# Patient Record
Sex: Male | Born: 1945 | Race: White | Hispanic: No | Marital: Married | Smoking: Never smoker
Health system: Southern US, Community
[De-identification: ages and names within clinical notes are randomized; demographics above are authoritative.]

## PROBLEM LIST (undated history)

## (undated) DIAGNOSIS — E785 Hyperlipidemia, unspecified: Secondary | ICD-10-CM

## (undated) DIAGNOSIS — R202 Paresthesia of skin: Secondary | ICD-10-CM

## (undated) DIAGNOSIS — M1990 Unspecified osteoarthritis, unspecified site: Secondary | ICD-10-CM

## (undated) DIAGNOSIS — R030 Elevated blood-pressure reading, without diagnosis of hypertension: Secondary | ICD-10-CM

## (undated) DIAGNOSIS — K219 Gastro-esophageal reflux disease without esophagitis: Secondary | ICD-10-CM

## (undated) HISTORY — DX: Gastro-esophageal reflux disease without esophagitis: K21.9

## (undated) HISTORY — DX: Paresthesia of skin: R20.2

## (undated) HISTORY — PX: COLONOSCOPY: SHX174

## (undated) HISTORY — PX: INGUINAL HERNIA REPAIR: SUR1180

## (undated) HISTORY — DX: Hyperlipidemia, unspecified: E78.5

## (undated) HISTORY — DX: Unspecified osteoarthritis, unspecified site: M19.90

## (undated) HISTORY — DX: Elevated blood-pressure reading, without diagnosis of hypertension: R03.0

---

## 2018-10-23 ENCOUNTER — Encounter: Payer: Self-pay | Admitting: Neurology

## 2018-11-28 ENCOUNTER — Other Ambulatory Visit: Payer: Self-pay

## 2018-11-29 ENCOUNTER — Ambulatory Visit (INDEPENDENT_AMBULATORY_CARE_PROVIDER_SITE_OTHER): Payer: Medicare Other | Admitting: Neurology

## 2018-11-29 ENCOUNTER — Encounter: Payer: Self-pay | Admitting: Neurology

## 2018-11-29 ENCOUNTER — Other Ambulatory Visit: Payer: Self-pay

## 2018-11-29 VITALS — BP 147/90 | HR 81 | Ht 71.0 in | Wt 193.0 lb

## 2018-11-29 DIAGNOSIS — R29818 Other symptoms and signs involving the nervous system: Secondary | ICD-10-CM | POA: Diagnosis not present

## 2018-11-29 DIAGNOSIS — G459 Transient cerebral ischemic attack, unspecified: Secondary | ICD-10-CM

## 2018-11-29 NOTE — Patient Instructions (Addendum)
Request records of MRI brain from PCP's office  CT angiogram of the head and neck.  We will call you with these results and let you know the next step.

## 2018-11-29 NOTE — Progress Notes (Signed)
[facility] Neurology [facility] Clinic Note - Initial Visit   Date: 11/29/18  [id number] DOB: 02/21/1945   Dear Dr. Egbert GaribaldiSlatosky:  Thank you for your kind referral of Bruce Watts for consultation of left side tingling. Although his history is well known to you, please allow us to reiterate it for the purpose of our medical record. The patient was accompanied to the clinic by self.   History of Present Illness: Bruce Watts is a 73 y.o. right-handed male with asthma, GERD, and hyperlipidemia presenting for evaluation of left face and arm paresthesia.   Starting around April/May 2020, he noticed numbness over the left side of the face which has remained constant since onset.  He also developed tingling over the left arm and leg which lasts a few hours and occurs several times per week.  He denies any specific trigger.  He denies vision changes, headache, arm or leg weakness. MRI brain was performed in October, which shows mild microvascular changes, no evidence of stroke.    Over the past three months, his blood pressure has been elevated.  Today, his BP is 147/90.  He previously worked in [employer].   Out-side paper records, electronic medical record, and images have been reviewed where available and summarized as:  Labs 07/16/2018:  lipid panel total cholesterol 161, triglycerides 163, HDL 31, LDL 97  MRI head without contrast 10/29/2018: No acute abnormality.  Mild white matter changes most likely chronic microvascular ischemia.  Past Medical History:  Diagnosis Date  . DJD (degenerative joint disease)   . Elevated blood pressure reading without diagnosis of hypertension   . GERD (gastroesophageal reflux disease)   . Hyperlipidemia   . Paresthesia     Past Surgical History:  Procedure Laterality Date  . COLONOSCOPY    . INGUINAL HERNIA REPAIR       Medications:  Outpatient Encounter Medications as of 11/29/2018  Medication Sig  . albuterol  (VENTOLIN HFA) 108 (90 Base) MCG/ACT inhaler   . fluticasone furoate-vilanterol (BREO ELLIPTA) 200-25 MCG/INH AEPB Inhale 1 puff into the lungs daily.  Marland Kitchen. omeprazole (PRILOSEC) 40 MG capsule Take 40 mg by mouth daily.  . pravastatin (PRAVACHOL) 20 MG tablet Take 20 mg by mouth daily.  . sildenafil (VIAGRA) 100 MG tablet Take 100 mg by mouth daily as needed for erectile dysfunction.   No facility-administered encounter medications on file as of 11/29/2018.     Allergies: No Known Allergies  Family History: Family History  Problem Relation Age of Onset  . Arthritis/Rheumatoid Mother   . CVA Mother   . Prostate cancer Brother     Social History: Social History   Tobacco Use  . Smoking status: Never Smoker  . Smokeless tobacco: Never Used  Substance Use Topics  . Alcohol use: Never    Frequency: Never  . Drug use: Never   Social History   Social History Narrative   Right handed   Two story home    Review of Systems:  CONSTITUTIONAL: No fevers, chills, night sweats, or weight loss.   EYES: No visual changes or eye pain ENT: No hearing changes.  No history of nose bleeds.   RESPIRATORY: No cough, wheezing and shortness of breath.   CARDIOVASCULAR: Negative for chest pain, and palpitations.   GI: Negative for abdominal discomfort, blood in stools or black stools.  No recent change in bowel habits.   GU:  No history of incontinence.   MUSCLOSKELETAL: No history of joint pain  or swelling.  No myalgias.   SKIN: Negative for lesions, rash, and itching.   HEMATOLOGY/ONCOLOGY: Negative for prolonged bleeding, bruising easily, and swollen nodes.  No history of cancer.   ENDOCRINE: Negative for cold or heat intolerance, polydipsia or goiter.   PSYCH:  No depression or anxiety symptoms.   NEURO: As Above.   Vital Signs:  BP (!) 147/90   Pulse 81   Ht 5\' 11"  (1.803 m)   Wt 193 lb (87.5 kg)   SpO2 97%   BMI 26.92 kg/m    General Medical Exam:   General:  Well appearing,  comfortable.   Eyes/ENT: see cranial nerve examination.   Neck:   No carotid bruits. Respiratory:  Clear to auscultation, good air entry bilaterally.   Cardiac:  Regular rate and rhythm, no murmur.   Extremities:  No deformities, edema, or skin discoloration.  Skin:  No rashes or lesions.  Neurological Exam: MENTAL STATUS including orientation to time, place, person, recent and remote memory, attention span and concentration, language, and fund of knowledge is normal.  Speech is not dysarthric.  CRANIAL NERVES: II:  No visual field defects.  Unremarkable fundi.   III-IV-VI: Pupils equal round and reactive to light.  Normal conjugate, extra-ocular eye movements in all directions of gaze.  No nystagmus.  No ptosis.   V:  Normal facial sensation.    VII:  Normal facial symmetry and movements.   VIII:  Normal hearing and vestibular function.   IX-X:  Normal palatal movement.   XI:  Normal shoulder shrug and head rotation.   XII:  Normal tongue strength and range of motion, no deviation or fasciculation.  MOTOR:  No atrophy, fasciculations or abnormal movements.  No pronator drift.   Upper Extremity:  Right  Left  Deltoid  5/5   5/5   Biceps  5/5   5/5   Triceps  5/5   5/5   Infraspinatus 5/5  5/5  Medial pectoralis 5/5  5/5  Wrist extensors  5/5   5/5   Wrist flexors  5/5   5/5   Finger extensors  5/5   5/5   Finger flexors  5/5   5/5   Dorsal interossei  5/5   5/5   Abductor pollicis  5/5   5/5   Tone (Ashworth scale)  0  0   Lower Extremity:  Right  Left  Hip flexors  5/5   5/5   Hip extensors  5/5   5/5   Adductor 5/5  5/5  Abductor 5/5  5/5  Knee flexors  5/5   5/5   Knee extensors  5/5   5/5   Dorsiflexors  5/5   5/5   Plantarflexors  5/5   5/5   Toe extensors  5/5   5/5   Toe flexors  5/5   5/5   Tone (Ashworth scale)  0  0   MSRs:  Right        Left                  brachioradialis 2+  2+  biceps 2+  2+  triceps 2+  2+  patellar 2+  2+  ankle jerk 2+  2+   Hoffman no  no  plantar response down  down   SENSORY:  Normal and symmetric perception of light touch, pinprick, vibration, and proprioception.  Romberg's sign absent.   COORDINATION/GAIT: Normal finger-to- nose-finger and heel-to-shin.  Intact rapid alternating movements bilaterally.  Able to rise from a chair without using arms.  Gait narrow based and stable. Tandem and stressed gait intact.    IMPRESSION: Left face and arm paresthesias.    - MRI brain shows no acute findings to explain paresthesias  - Exam is normal today  - Proceed with CTA head and neck to evaluate vessel status, if normal and he remains symptomatic, consider MRI cervical spine.   Thank you for allowing me to participate in patient's care.  If I can answer any additional questions, I would be pleased to do so.    Sincerely,    Anabelen Kaminsky K. Posey Pronto, DO

## 2018-12-13 ENCOUNTER — Ambulatory Visit
Admission: RE | Admit: 2018-12-13 | Discharge: 2018-12-13 | Disposition: A | Discharge status: Home / Self Care | Payer: Medicare Other | Source: Ambulatory Visit | Attending: Neurology | Admitting: Neurology

## 2018-12-13 ENCOUNTER — Other Ambulatory Visit: Payer: Self-pay

## 2018-12-13 DIAGNOSIS — R29818 Other symptoms and signs involving the nervous system: Secondary | ICD-10-CM

## 2018-12-13 DIAGNOSIS — G459 Transient cerebral ischemic attack, unspecified: Secondary | ICD-10-CM

## 2018-12-13 MED ORDER — IOPAMIDOL (ISOVUE-370) INJECTION 76%
75.0000 mL | Freq: Once | INTRAVENOUS | Status: AC | PRN
  Administered 2018-12-13: 75 mL via INTRAVENOUS

## 2018-12-17 ENCOUNTER — Other Ambulatory Visit (HOSPITAL_COMMUNITY): Payer: Self-pay | Admitting: Interventional Radiology

## 2018-12-17 ENCOUNTER — Other Ambulatory Visit: Payer: Self-pay

## 2018-12-17 ENCOUNTER — Telehealth: Payer: Self-pay | Admitting: Neurology

## 2018-12-17 DIAGNOSIS — G459 Transient cerebral ischemic attack, unspecified: Secondary | ICD-10-CM

## 2018-12-17 DIAGNOSIS — I773 Arterial fibromuscular dysplasia: Secondary | ICD-10-CM

## 2018-12-17 DIAGNOSIS — R29818 Other symptoms and signs involving the nervous system: Secondary | ICD-10-CM

## 2018-12-17 NOTE — Telephone Encounter (Signed)
Faxed notes to patients PCP and note Dr.Deveshwar office called back and they will contact patient and get patient scheduled for next week

## 2018-12-17 NOTE — Telephone Encounter (Signed)
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  These findings would explain transient left arm numbness.  To better assess his vessel status, I will refer him for cerebral angiogram.  In the meantime, I discussed medical management with continuation of aspirin 81mg , continue pravastatin 20mg , and will have him follow-up with his PCP for BP management.  He tells me that his BP has been elevated but he is currently not on medication.    Donika K. Posey Pronto, DO

## 2018-12-17 NOTE — Telephone Encounter (Signed)
Pending appt, left message with [place] [phone].at [facility]

## 2018-12-21 ENCOUNTER — Other Ambulatory Visit: Payer: Self-pay | Admitting: Radiology

## 2018-12-23 ENCOUNTER — Other Ambulatory Visit: Payer: Self-pay | Admitting: Student

## 2018-12-24 ENCOUNTER — Other Ambulatory Visit (HOSPITAL_COMMUNITY): Payer: Self-pay | Admitting: Interventional Radiology

## 2018-12-24 ENCOUNTER — Other Ambulatory Visit: Payer: Self-pay

## 2018-12-24 ENCOUNTER — Telehealth (HOSPITAL_COMMUNITY): Payer: Self-pay

## 2018-12-24 ENCOUNTER — Encounter (HOSPITAL_COMMUNITY): Payer: Self-pay

## 2018-12-24 ENCOUNTER — Ambulatory Visit (HOSPITAL_COMMUNITY)
Admission: RE | Admit: 2018-12-24 | Discharge: 2018-12-24 | Disposition: A | Discharge status: Home / Self Care | Payer: Medicare Other | Source: Ambulatory Visit | Attending: Interventional Radiology | Admitting: Interventional Radiology

## 2018-12-24 DIAGNOSIS — Z7982 Long term (current) use of aspirin: Secondary | ICD-10-CM | POA: Insufficient documentation

## 2018-12-24 DIAGNOSIS — I773 Arterial fibromuscular dysplasia: Secondary | ICD-10-CM

## 2018-12-24 DIAGNOSIS — G459 Transient cerebral ischemic attack, unspecified: Secondary | ICD-10-CM

## 2018-12-24 DIAGNOSIS — E785 Hyperlipidemia, unspecified: Secondary | ICD-10-CM | POA: Insufficient documentation

## 2018-12-24 DIAGNOSIS — R29818 Other symptoms and signs involving the nervous system: Secondary | ICD-10-CM

## 2018-12-24 DIAGNOSIS — K219 Gastro-esophageal reflux disease without esophagitis: Secondary | ICD-10-CM | POA: Insufficient documentation

## 2018-12-24 DIAGNOSIS — I720 Aneurysm of carotid artery: Secondary | ICD-10-CM | POA: Diagnosis not present

## 2018-12-24 DIAGNOSIS — Z79899 Other long term (current) drug therapy: Secondary | ICD-10-CM | POA: Diagnosis not present

## 2018-12-24 DIAGNOSIS — Z7951 Long term (current) use of inhaled steroids: Secondary | ICD-10-CM | POA: Insufficient documentation

## 2018-12-24 HISTORY — PX: IR ANGIO VERTEBRAL SEL VERTEBRAL BILAT MOD SED: IMG5369

## 2018-12-24 HISTORY — PX: IR US GUIDE VASC ACCESS RIGHT: IMG2390

## 2018-12-24 HISTORY — PX: IR ANGIO INTRA EXTRACRAN SEL COM CAROTID INNOMINATE BILAT MOD SED: IMG5360

## 2018-12-24 LAB — BASIC METABOLIC PANEL
Anion gap: 11 (ref 5–15)
BUN: 13 mg/dL (ref 8–23)
CO2: 24 mmol/L (ref 22–32)
Calcium: 9 mg/dL (ref 8.9–10.3)
Chloride: 106 mmol/L (ref 98–111)
Creatinine, Ser: 1.08 mg/dL (ref 0.61–1.24)
GFR calc Af Amer: >60 mL/min (ref >60)
GFR calc non Af Amer: >60 mL/min (ref >60)
Glucose, Bld: 115 mg/dL — ABNORMAL HIGH (ref 70–99)
Potassium: 4.5 mmol/L (ref 3.5–5.1)
Sodium: 141 mmol/L (ref 135–145)

## 2018-12-24 LAB — CBC
HCT: 48.9 % (ref 39.0–52.0)
Hemoglobin: 16.3 g/dL (ref 13.0–17.0)
MCH: 31.6 pg (ref 26.0–34.0)
MCHC: 33.3 g/dL (ref 30.0–36.0)
MCV: 94.8 fL (ref 80.0–100.0)
Platelets: 209 10*3/uL (ref 150–400)
RBC: 5.16 MIL/uL (ref 4.22–5.81)
RDW: 13.4 % (ref 11.5–15.5)
WBC: 7.7 10*3/uL (ref 4.0–10.5)
nRBC: 0 % (ref 0.0–0.2)

## 2018-12-24 LAB — PROTIME-INR
INR: 1 (ref 0.8–1.2)
Prothrombin Time: 13.3 seconds (ref 11.4–15.2)

## 2018-12-24 MED ORDER — NITROGLYCERIN 1 MG/10 ML FOR IR/CATH LAB
INTRA_ARTERIAL | Status: AC
  Filled 2018-12-24: qty 10

## 2018-12-24 MED ORDER — HEPARIN SODIUM (PORCINE) 1000 UNIT/ML IJ SOLN
INTRAMUSCULAR | Status: AC | PRN
  Administered 2018-12-24: 2000 [IU] via INTRA_ARTERIAL

## 2018-12-24 MED ORDER — FENTANYL CITRATE (PF) 100 MCG/2ML IJ SOLN
INTRAMUSCULAR | Status: AC | PRN
  Administered 2018-12-24: 25 ug via INTRAVENOUS

## 2018-12-24 MED ORDER — HEPARIN SODIUM (PORCINE) 1000 UNIT/ML IJ SOLN
INTRAMUSCULAR | Status: AC
  Filled 2018-12-24: qty 1

## 2018-12-24 MED ORDER — MIDAZOLAM HCL 2 MG/2ML IJ SOLN
INTRAMUSCULAR | Status: AC
  Filled 2018-12-24: qty 2

## 2018-12-24 MED ORDER — VERAPAMIL HCL 2.5 MG/ML IV SOLN
INTRAVENOUS | Status: AC
  Filled 2018-12-24: qty 2

## 2018-12-24 MED ORDER — FENTANYL CITRATE (PF) 100 MCG/2ML IJ SOLN
INTRAMUSCULAR | Status: AC
  Filled 2018-12-24: qty 2

## 2018-12-24 MED ORDER — IOHEXOL 300 MG/ML  SOLN
50.0000 mL | Freq: Once | INTRAMUSCULAR | Status: AC | PRN
  Administered 2018-12-24: 25 mL via INTRA_ARTERIAL

## 2018-12-24 MED ORDER — LIDOCAINE HCL 1 % IJ SOLN
INTRAMUSCULAR | Status: AC | PRN
  Administered 2018-12-24: 1 mL

## 2018-12-24 MED ORDER — IOHEXOL 300 MG/ML  SOLN
150.0000 mL | Freq: Once | INTRAMUSCULAR | Status: AC | PRN
  Administered 2018-12-24: 75 mL via INTRA_ARTERIAL

## 2018-12-24 MED ORDER — VERAPAMIL HCL 2.5 MG/ML IV SOLN
INTRAVENOUS | Status: AC | PRN
  Administered 2018-12-24: 2.5 mg via INTRA_ARTERIAL

## 2018-12-24 MED ORDER — NITROGLYCERIN 1 MG/10 ML FOR IR/CATH LAB
INTRA_ARTERIAL | Status: AC | PRN
  Administered 2018-12-24 (×2): 200 ug via INTRA_ARTERIAL

## 2018-12-24 MED ORDER — MIDAZOLAM HCL 2 MG/2ML IJ SOLN
INTRAMUSCULAR | Status: AC | PRN
  Administered 2018-12-24: 1 mg via INTRAVENOUS

## 2018-12-24 MED ORDER — LIDOCAINE HCL 1 % IJ SOLN
INTRAMUSCULAR | Status: AC
  Filled 2018-12-24: qty 20

## 2018-12-24 MED ORDER — SODIUM CHLORIDE 0.9 % IV SOLN: Freq: Once | INTRAVENOUS | Status: DC

## 2018-12-24 NOTE — Sedation Documentation (Addendum)
Right radial sheath removed, 20cc air applied in TR band @ [id number] by Dr. Estanislado Pandy.

## 2018-12-24 NOTE — Telephone Encounter (Signed)
Called to schedule consult, no answer, left vm. AW  

## 2018-12-24 NOTE — Procedures (Signed)
S/P 4 vessel cerebral arteriogram RT rad approach. 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 S.Mairyn Lenahan MD

## 2018-12-24 NOTE — Discharge Instructions (Signed)
DRINK PLENTY OF FLUIDS FOR THE NEXT 2-3 DAYS.  KEEP ARM ELEVATED THE REMAINDER OF THE DAY.   Radial Site Care  This sheet gives you information about how to care for yourself after your procedure. Your health care provider may also give you more specific instructions. If you have problems or questions, contact your health care provider. What can I expect after the procedure? After the procedure, it is common to have:  Bruising and tenderness at the catheter insertion area. Follow these instructions at home: Medicines  Take over-the-counter and prescription medicines only as told by your health care provider. Insertion site care  Follow instructions from your health care provider about how to take care of your insertion site. Make sure you: ? Wash your hands with soap and water before you change your bandage (dressing). If soap and water are not available, use hand sanitizer. ? Change your dressing as told by your health care provider.  Check your insertion site every day for signs of infection. Check for: ? Redness, swelling, or pain. ? Fluid or blood. ? Pus or a bad smell. ? Warmth.  Do not take baths, swim, or use a hot tub until your health care provider approves.  You may shower 24-48 hours after the procedure, or as directed by your health care provider. ? Remove the dressing and gently wash the site with plain soap and water. ? Pat the area dry with a clean towel. ? Do not rub the site. That could cause bleeding.  Do not apply powder or lotion to the site. Activity   For 24 hours after the procedure, or as directed by your health care provider: ? Do not flex or bend the affected arm. ? Do not push or pull heavy objects with the affected arm. ? Do not drive yourself home from the hospital or clinic. You may drive 24 hours after the procedure unless your health care provider tells you not to. ? Do not operate machinery or power tools.  Do not lift anything that  is heavier than 10 lb for 5 days.  Ask your health care provider when it is okay to: ? Return to work or school. ? Resume usual physical activities or sports. ? Resume sexual activity. General instructions  If the catheter site starts to bleed, raise your arm and put firm pressure on the site. If the bleeding does not stop, get help right away. This is a medical emergency.  If you went home on the same day as your procedure, a responsible adult should be with you for the first 24 hours after you arrive home.  Keep all follow-up visits as told by your health care provider. This is important. Contact a health care provider if:  You have a fever.  You have redness, swelling, or yellow drainage around your insertion site. Get help right away if:  You have unusual pain at the radial site.  The catheter insertion area swells very fast.  The insertion area is bleeding, and the bleeding does not stop when you hold steady pressure on the area.  Your arm or hand becomes pale, cool, tingly, or numb. These symptoms may represent a serious problem that is an emergency. Do not wait to see if the symptoms will go away. Get medical help right away. Call your local emergency services (911 in the [place] Do not drive yourself to the hospital. Summary  After the procedure, it is common to have bruising and tenderness at the  site.  Follow instructions from your health care provider about how to take care of your radial site wound. Check the wound every day for signs of infection.  Do not lift anything that is heavier than 10 lb for 5 days.  This information is not intended to replace advice given to you by your health care provider. Make sure you discuss any questions you have with your health care provider. Document Released: 01/28/2010 Document Revised: 01/31/2017 Document Reviewed: 01/31/2017 Elsevier Patient Education  2020 [employer].

## 2018-12-24 NOTE — H&P (Signed)
Chief Complaint: Patient was seen in consultation today for cerebral arteriogram at the request of Dr Nita Sickle   Supervising Physician: Julieanne Cotton  Patient Status: [facility] - Out-pt  History of Present Illness: Bruce Watts is a 73 y.o. 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 Areas of irregular pseudoaneurysm formation without a dominant pseudoaneurysm. These abnormalities could be a cause of embolic disease. No large or medium vessel occlusion is identified with respect to the intracranial circulation.  Tingling in arm and left face still occuring-- even this am Almost constantly Denies vision or speech changes No N/V Denies dizziness  She has referred pt to Dr Corliss Skains for evaluation of these findings Scheduled now for cerebral arteriogram  Past Medical History:  Diagnosis Date  . DJD (degenerative joint disease)   . Elevated blood pressure reading without diagnosis of hypertension   . GERD (gastroesophageal reflux disease)   . Hyperlipidemia   . Paresthesia     Past Surgical History:  Procedure Laterality Date  . COLONOSCOPY    . INGUINAL HERNIA REPAIR      Allergies: Patient has no known allergies.  Medications: Prior to Admission medications   Medication Sig Start Date End Date Taking? Authorizing Provider  aspirin EC 81 MG tablet Take 81 mg by mouth daily.   Yes [provider]  fluticasone furoate-vilanterol (BREO ELLIPTA) 200-25 MCG/INH AEPB Inhale 1 puff into the lungs daily.   Yes [provider]  omeprazole  (PRILOSEC) 40 MG capsule Take 40 mg by mouth daily.   Yes [provider]  pravastatin (PRAVACHOL) 20 MG tablet Take 20 mg by mouth daily.   Yes [provider]  albuterol (VENTOLIN HFA) 108 (90 Base) MCG/ACT inhaler  11/25/18   [provider]  sildenafil (VIAGRA) 100 MG tablet Take 100 mg by mouth daily as needed for erectile dysfunction.    [provider]     Family History  Problem Relation Age of Onset  . Arthritis/Rheumatoid Mother   . CVA Mother   . Prostate cancer Brother     Social History   Socioeconomic History  . Marital status: Married    Spouse name: Not on file  . Number of children: Not on file  . Years of education: Not on file  . Highest education level: Not on file  Occupational History  . Occupation: retired  Tobacco Use  . Smoking status: Never Smoker  . Smokeless tobacco: Never Used  Substance and Sexual Activity  . Alcohol use: Never  . Drug use: Never  . Sexual activity: Not Currently  Other Topics Concern  . Not on file  Social History Narrative   Right handed   Two story home   Social Determinants of Health   Financial Resource Strain:   . Difficulty of Paying Living Expenses: Not on file  Food Insecurity:   . Worried About Programme researcher, broadcasting/film/video in the Last Year: Not on file  . Ran Out of Food in the Last Year: Not on file  Transportation Needs:   . Lack of Transportation (Medical): Not on file  . Lack of  Transportation (Non-Medical): Not on file  Physical Activity:   . Days of Exercise per Week: Not on file  . Minutes of Exercise per Session: Not on file  Stress:   . Feeling of Stress : Not on file  Social Connections:   . Frequency of Communication with Friends and Family: Not on file  . Frequency of Social Gatherings with Friends and Family: Not on file  . Attends Religious Services: Not on file  . Active Member of Clubs or Organizations: Not on file  . Attends Banker Meetings: Not on  file  . Marital Status: Not on file    Review of Systems: A 12 point ROS discussed and pertinent positives are indicated in the HPI above.  All other systems are negative.  Review of Systems  Constitutional: Negative for activity change, appetite change, fatigue and fever.  HENT: Negative for tinnitus and trouble swallowing.   Eyes: Negative for visual disturbance.  Respiratory: Negative for cough.   Cardiovascular: Negative for chest pain.  Gastrointestinal: Negative for abdominal pain and nausea.  Musculoskeletal: Negative for back pain and gait problem.  Neurological: Positive for numbness. Negative for dizziness, tremors, seizures, syncope, facial asymmetry, speech difficulty, weakness, light-headedness and headaches.  Psychiatric/Behavioral: Negative for behavioral problems and confusion.    Vital Signs: BP (!) 181/72   Pulse 60   Temp 97.6 F (36.4 C) (Oral)   Resp 17   Ht  (1.803 m)   Wt 195 lb (88.5 kg)   SpO2 99%   BMI 27.20 kg/m   Physical Exam Vitals reviewed.  HENT:     Head: Atraumatic.     Mouth/Throat:     Mouth: Mucous membranes are moist.  Eyes:     Extraocular Movements: Extraocular movements intact.  Cardiovascular:     Rate and Rhythm: Normal rate and regular rhythm.     Heart sounds: Normal heart sounds.  Pulmonary:     Effort: Pulmonary effort is normal.     Breath sounds: Normal breath sounds.  Abdominal:     Palpations: Abdomen is soft.  Musculoskeletal:        General: Normal range of motion.     Cervical back: Normal range of motion.  Skin:    General: Skin is warm and dry.  Neurological:     Mental Status: He is alert and oriented to person, place, and time.  Psychiatric:        Mood and Affect: Mood normal.        Behavior: Behavior normal.        Thought Content: Thought content normal.        Judgment: Judgment normal.     Imaging: CT ANGIO HEAD W OR WO CONTRAST  Result Date: 12/14/2018 CLINICAL DATA:  Left facial  tingling for the last several months. EXAM: CT ANGIOGRAPHY HEAD AND NECK TECHNIQUE: Multidetector CT imaging of the head and neck was performed using the standard protocol during bolus administration of intravenous contrast. Multiplanar CT image reconstructions and MIPs were obtained to evaluate the vascular anatomy. Carotid stenosis measurements (when applicable) are obtained utilizing NASCET criteria, using the distal internal carotid diameter as the denominator. CONTRAST:  75mL ISOVUE-370 IOPAMIDOL (ISOVUE-370) INJECTION 76% COMPARISON:  MRI 10/29/2018 FINDINGS: CT HEAD FINDINGS Brain: The brain shows a normal appearance without evidence of malformation, atrophy, old or acute small or large vessel infarction, mass lesion, hemorrhage, hydrocephalus or extra-axial collection. Vascular: Minimal atherosclerotic change of the major vessels at the base brain. Skull:  Normal.  No traumatic finding.  No focal bone lesion. Sinuses/Orbits: Sinuses are clear. Orbits appear normal. Mastoids are clear. Other: None significant CTA NECK FINDINGS Aortic arch: Aortic atherosclerosis. Branching pattern is normal. No origin stenosis. Right carotid system: Common carotid artery widely patent to the bifurcation. Carotid bifurcation is normal without soft or calcified plaque. No narrowing or irregularity. Cervical ICA is tortuous. Pronounced irregularity in the midportion of the cervical ICA consistent with fibromuscular disease. There is dissection of the upper cervical ICA with complex irregular intimal flaps. This could certainly be a cause of embolic disease. Left carotid system: Common carotid artery widely patent to the bifurcation. No atherosclerotic disease at the carotid bifurcation. Cervical ICA is tortuous with some irregularity consistent with fibromuscular disease. Similarly, there is a localized dissection in the upper cervical ICA, which could be a source of embolic disease. Vertebral arteries: Both vertebral artery  origins are widely patent. Both vertebral arteries appear widely patent through the cervical region. No sign of vertebral fibromuscular disease. Skeleton: Ordinary cervical spondylosis. Old appearing compression fracture noted at T1 and T3. Other neck: No mass or lymphadenopathy. Upper chest: Chronic nodal prominence in the superior mediastinum, similar to the CT scan of 2018, therefore likely benign. Review of the MIP images confirms the above findings CTA HEAD FINDINGS Anterior circulation: Both internal carotid arteries are widely patent through the skull base and siphon regions. The anterior and middle cerebral arteries are patent without proximal stenosis, aneurysm or vascular malformation. Posterior circulation: Both vertebral arteries are widely patent to the basilar. No basilar stenosis. Posterior circulation branch vessels are patent and normal. Venous sinuses: Patent and normal. Anatomic variants: None significant. 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 Areas of irregular pseudoaneurysm formation without a dominant pseudoaneurysm. These abnormalities could be a cause of embolic disease. No large or medium vessel occlusion is identified with respect to the intracranial circulation. Report will be called to the provider's office upon opening. Electronically Signed   By: Nelson Chimes M.D.   On: 12/14/2018 12:34   CT ANGIO NECK W OR WO CONTRAST  Result Date: 12/14/2018 CLINICAL DATA:  Left facial tingling for the last several months. EXAM: CT ANGIOGRAPHY HEAD AND NECK TECHNIQUE: Multidetector CT imaging of the head and neck was performed using the standard protocol during bolus administration of intravenous contrast. Multiplanar CT image reconstructions and MIPs were obtained to evaluate  the vascular anatomy. Carotid stenosis measurements (when applicable) are obtained utilizing NASCET criteria, using the distal internal carotid diameter as the denominator. CONTRAST:  69mL ISOVUE-370 IOPAMIDOL (ISOVUE-370) INJECTION 76% COMPARISON:  MRI 10/29/2018 FINDINGS: CT HEAD FINDINGS Brain: The brain shows a normal appearance without evidence of malformation, atrophy, old or acute small or large vessel infarction, mass lesion, hemorrhage, hydrocephalus or extra-axial collection. Vascular: Minimal atherosclerotic change of the major vessels at the base brain. Skull: Normal.  No traumatic finding.  No focal bone lesion. Sinuses/Orbits: Sinuses are clear. Orbits appear normal. Mastoids are clear. Other: None significant CTA NECK FINDINGS Aortic arch: Aortic atherosclerosis. Branching pattern is normal. No origin stenosis. Right carotid system: Common carotid artery widely patent to the bifurcation. Carotid bifurcation is normal without soft or calcified plaque. No narrowing or irregularity. Cervical ICA is tortuous. Pronounced irregularity in the midportion of the cervical ICA consistent with fibromuscular disease. There is dissection  of the upper cervical ICA with complex irregular intimal flaps. This could certainly be a cause of embolic disease. Left carotid system: Common carotid artery widely patent to the bifurcation. No atherosclerotic disease at the carotid bifurcation. Cervical ICA is tortuous with some irregularity consistent with fibromuscular disease. Similarly, there is a localized dissection in the upper cervical ICA, which could be a source of embolic disease. Vertebral arteries: Both vertebral artery origins are widely patent. Both vertebral arteries appear widely patent through the cervical region. No sign of vertebral fibromuscular disease. Skeleton: Ordinary cervical spondylosis. Old appearing compression fracture noted at T1 and T3. Other neck: No mass or lymphadenopathy. Upper chest: Chronic  nodal prominence in the superior mediastinum, similar to the CT scan of 2018, therefore likely benign. Review of the MIP images confirms the above findings CTA HEAD FINDINGS Anterior circulation: Both internal carotid arteries are widely patent through the skull base and siphon regions. The anterior and middle cerebral arteries are patent without proximal stenosis, aneurysm or vascular malformation. Posterior circulation: Both vertebral arteries are widely patent to the basilar. No basilar stenosis. Posterior circulation branch vessels are patent and normal. Venous sinuses: Patent and normal. Anatomic variants: None significant. 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 Areas of irregular pseudoaneurysm formation without a dominant pseudoaneurysm. These abnormalities could be a cause of embolic disease. No large or medium vessel occlusion is identified with respect to the intracranial circulation. Report will be called to the provider's office upon opening. Electronically Signed   By: Paulina FusiMark  Shogry M.D.   On: 12/14/2018 12:34    Labs:  CBC: No results for input(s): WBC, HGB, HCT, PLT in the last 8760 hours.  COAGS: No results for input(s): INR, APTT in the last 8760 hours.  BMP: No results for input(s): NA, K, CL, CO2, GLUCOSE, BUN, CALCIUM, CREATININE, GFRNONAA, GFRAA in the last 8760 hours.  Invalid input(s): CMP  LIVER FUNCTION TESTS: No results for input(s): BILITOT, AST, ALT, ALKPHOS, PROT, ALBUMIN in the last 8760 hours.  TUMOR MARKERS: No results for input(s): AFPTM, CEA, CA199, CHROMGRNA in the last 8760 hours.  Assessment and Plan:  Left arm and face numbness/tingling since April 2020 Imaging negative Referral to Dr Loistine Simas Patel CTA did reveal Fibromuscular disease of  both cervical internal carotid arteries with irregular narrowing and ectasia. 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 Areas of irregular pseudoaneurysm formation without a dominant Pseudoaneurysm. Now scheduled for cerebral arteriogram Risks and benefits of cerebral angiogram with intervention were discussed with the patient including, but not limited to bleeding, infection, vascular injury, contrast induced renal failure, stroke or even death.  This interventional procedure involves the use of X-rays and because of the nature of the planned procedure, it is possible that we will have prolonged use of X-ray fluoroscopy.  Potential radiation risks to you include (but are not limited to) the following: - A slightly elevated risk for cancer  several years later in life. This risk is typically less than 0.5% percent. This risk is low in comparison to the normal incidence of human cancer, which is 33% for women and 50% for men according to the [employer]. - Radiation induced injury can include skin redness, resembling a rash, tissue breakdown / ulcers and hair loss (which can be temporary  or permanent).   The likelihood of either of these occurring depends on the difficulty of the procedure and whether you are sensitive to radiation due to previous procedures, disease, or genetic conditions.   IF your procedure requires a prolonged use of radiation, you will be notified and given written instructions for further action.  It is your responsibility to monitor the irradiated area for the 2 weeks following the procedure and to notify your physician if you are concerned that you have suffered a radiation induced injury.    All of the patient's questions were answered, patient is agreeable to proceed.  Consent signed and in chart.  Thank you for this interesting consult.  I greatly enjoyed meeting Bruce Watts and look forward to  participating in their care.  A copy of this report was sent to the requesting provider on this date.  Electronically Signed: Robet Leu, PA-C 12/24/2018, 9:11 AM   I spent a total of  30 Minutes   in face to face in clinical consultation, greater than 50% of which was counseling/coordinating care for cerebral arteriogram

## 2018-12-25 ENCOUNTER — Other Ambulatory Visit (HOSPITAL_COMMUNITY): Payer: Self-pay | Admitting: Interventional Radiology

## 2018-12-25 DIAGNOSIS — I773 Arterial fibromuscular dysplasia: Secondary | ICD-10-CM

## 2018-12-25 DIAGNOSIS — G459 Transient cerebral ischemic attack, unspecified: Secondary | ICD-10-CM

## 2018-12-25 DIAGNOSIS — R29818 Other symptoms and signs involving the nervous system: Secondary | ICD-10-CM

## 2018-12-26 ENCOUNTER — Encounter (HOSPITAL_COMMUNITY): Payer: Self-pay

## 2018-12-26 ENCOUNTER — Ambulatory Visit (HOSPITAL_COMMUNITY)
Admission: RE | Admit: 2018-12-26 | Discharge: 2018-12-26 | Disposition: A | Discharge status: Home / Self Care | Payer: Medicare Other | Source: Ambulatory Visit | Attending: Interventional Radiology | Admitting: Interventional Radiology

## 2018-12-26 ENCOUNTER — Other Ambulatory Visit (HOSPITAL_COMMUNITY): Payer: Self-pay | Admitting: Interventional Radiology

## 2018-12-26 ENCOUNTER — Other Ambulatory Visit: Payer: Self-pay

## 2018-12-26 DIAGNOSIS — I773 Arterial fibromuscular dysplasia: Secondary | ICD-10-CM

## 2018-12-26 DIAGNOSIS — G459 Transient cerebral ischemic attack, unspecified: Secondary | ICD-10-CM

## 2018-12-26 DIAGNOSIS — R29818 Other symptoms and signs involving the nervous system: Secondary | ICD-10-CM

## 2018-12-26 NOTE — Progress Notes (Signed)
Chief Complaint: Patient was seen today to discuss results of recent diagnostic cerebral angiogram  Referring Provider: Dr. Nita Sickle  Supervising Physician: Julieanne Cotton  Patient Status: [facility] - Out-pt  History of Present Illness: ODES LOLLI is a 73 y.o. male with a past medical history as below, with pertinent past medical history including DJD, GERD and HLD who began to experience left sided face and arm tingling in April of this year. He underwent an MRI on October 2020 which showed mild microvascular changes without evidence of stroke. 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 He was referred to [facility] by Dr. Nita Sickle and he underwent a diagnostic cerebral angiogram with Dr. Corliss Skains on 12/24/18 which showed an approximately 13.6 mm x 5.6 mm outpouching of the right internal carotid artery at the junction of the cervical and middle 1/3 associated with fusiform dilatation of a total of 22.5 mm. 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 Underlying fibromuscular dysplasia by a differential consideration versus trauma versus spontaneous dissections associated with pseudoaneurysm formation. He presents today with his wife to discuss these results and care plan moving forward.  Mr. Romanoski presents with his wife today, he reports that he still has the left sided tingling as previously  described and is unchanged. He has had a few episodes of light headedness when he stands up over the last 2 days which resolve in a few seconds and are not associated with dizziness or syncope. He denies any falls. The hematoma to his right wrist is still present but improving, he denies any pain, bleeding, numbness, tingling, temperature or color change to his right hand or arm. He denies any other complaints. He continues to take ASA 81 mg QD and a statin. He does not have a history of HTN or DM however he was told that he needs to see his PCP due to recent elevated BP readings. He does not use tobacco or illicit substances, he drinks alcohol very rarely. He is retired and enjoys working around [employer] - his wife expresses concern that he may have injured himself/could injure himself further as he often lifts heavy pieces of wood.   Past Medical History:  Diagnosis Date  . DJD (degenerative joint disease)   . Elevated blood pressure reading without diagnosis of hypertension   . GERD (gastroesophageal reflux disease)   . Hyperlipidemia   . Paresthesia     Past Surgical History:  Procedure Laterality Date  . COLONOSCOPY    . INGUINAL HERNIA REPAIR    . IR ANGIO INTRA EXTRACRAN SEL COM CAROTID INNOMINATE BILAT MOD SED  12/24/2018  . IR ANGIO VERTEBRAL SEL VERTEBRAL BILAT MOD SED  12/24/2018  . IR US GUIDE VASC ACCESS RIGHT  12/24/2018    Allergies: Patient has no known allergies.  Medications: Prior to Admission medications   Medication Sig Start Date End Date Taking? Authorizing Provider  albuterol (VENTOLIN HFA) 108 (90 Base) MCG/ACT inhaler  11/25/18   [provider]  aspirin EC 81 MG tablet Take 81 mg by  mouth daily.    [provider]  fluticasone furoate-vilanterol (BREO ELLIPTA) 200-25 MCG/INH AEPB Inhale 1 puff into the lungs daily.    [provider]  omeprazole (PRILOSEC) 40 MG capsule Take 40 mg by mouth daily.    [provider]    pravastatin (PRAVACHOL) 20 MG tablet Take 20 mg by mouth daily.    [provider]  sildenafil (VIAGRA) 100 MG tablet Take 100 mg by mouth daily as needed for erectile dysfunction.    [provider]     Family History  Problem Relation Age of Onset  . Arthritis/Rheumatoid Mother   . CVA Mother   . Prostate cancer Brother     Social History   Socioeconomic History  . Marital status: Married    Spouse name: Not on file  . Number of children: Not on file  . Years of education: Not on file  . Highest education level: Not on file  Occupational History  . Occupation: retired  Tobacco Use  . Smoking status: Never Smoker  . Smokeless tobacco: Never Used  Substance and Sexual Activity  . Alcohol use: Never  . Drug use: Never  . Sexual activity: Not Currently  Other Topics Concern  . Not on file  Social History Narrative   Right handed   Two story home   Social Determinants of Health   Financial Resource Strain:   . Difficulty of Paying Living Expenses: Not on file  Food Insecurity:   . Worried About Charity fundraiser in the Last Year: Not on file  . Ran Out of Food in the Last Year: Not on file  Transportation Needs:   . Lack of Transportation (Medical): Not on file  . Lack of Transportation (Non-Medical): Not on file  Physical Activity:   . Days of Exercise per Week: Not on file  . Minutes of Exercise per Session: Not on file  Stress:   . Feeling of Stress : Not on file  Social Connections:   . Frequency of Communication with Friends and Family: Not on file  . Frequency of Social Gatherings with Friends and Family: Not on file  . Attends Religious Services: Not on file  . Active Member of Clubs or Organizations: Not on file  . Attends Archivist Meetings: Not on file  . Marital Status: Not on file     Review of Systems: A 12 point ROS discussed and pertinent positives are indicated in the HPI above.  All other systems are  negative.  Review of Systems  Constitutional: Negative for activity change, appetite change, chills, fatigue and fever.  HENT: Negative for hearing loss, nosebleeds, tinnitus, trouble swallowing and voice change.   Eyes: Negative for photophobia, pain, redness and visual disturbance.  Respiratory: Negative for cough and shortness of breath.   Cardiovascular: Negative for chest pain and leg swelling.  Gastrointestinal: Negative for abdominal pain, blood in stool, diarrhea, nausea and vomiting.  Genitourinary: Negative for dysuria and hematuria.  Musculoskeletal: Positive for arthralgias (hx DJD). Negative for back pain, myalgias and neck pain.  Skin: Negative for rash and wound.       (+) bruising right wrist s/p NIR procedure - improving  Neurological: Positive for light-headedness (when standing up too fast) and numbness ("more like tingling to left side of my face than numbness"). Negative for dizziness, tremors, seizures, syncope, facial asymmetry, speech difficulty, weakness and headaches.  Hematological: Does not bruise/bleed easily.  Psychiatric/Behavioral: Negative for confusion.  Vital Signs: There were no vitals taken for this visit.  Physical Exam Constitutional:      General: He is not in acute distress.    Appearance: He is not ill-appearing.     Comments: Pleasant, good historian, wife present during consultation.  HENT:     Head: Normocephalic.  Pulmonary:     Effort: Pulmonary effort is normal.  Skin:    General: Skin is warm and dry.     Findings: Bruising (Right wrist hematoma - resolving, no pain to palpation or active bleeding. ) present.  Neurological:     Mental Status: He is alert and oriented to person, place, and time.     Cranial Nerves: No cranial nerve deficit.     Motor: No weakness.     Gait: Gait normal.  Psychiatric:        Mood and Affect: Mood normal.        Behavior: Behavior normal.        Thought Content: Thought content normal.         Judgment: Judgment normal.      Imaging: CT ANGIO HEAD W OR WO CONTRAST  Result Date: 12/14/2018 CLINICAL DATA:  Left facial tingling for the last several months. EXAM: CT ANGIOGRAPHY HEAD AND NECK TECHNIQUE: Multidetector CT imaging of the head and neck was performed using the standard protocol during bolus administration of intravenous contrast. Multiplanar CT image reconstructions and MIPs were obtained to evaluate the vascular anatomy. Carotid stenosis measurements (when applicable) are obtained utilizing NASCET criteria, using the distal internal carotid diameter as the denominator. CONTRAST:  54mL ISOVUE-370 IOPAMIDOL (ISOVUE-370) INJECTION 76% COMPARISON:  MRI 10/29/2018 FINDINGS: CT HEAD FINDINGS Brain: The brain shows a normal appearance without evidence of malformation, atrophy, old or acute small or large vessel infarction, mass lesion, hemorrhage, hydrocephalus or extra-axial collection. Vascular: Minimal atherosclerotic change of the major vessels at the base brain. Skull: Normal.  No traumatic finding.  No focal bone lesion. Sinuses/Orbits: Sinuses are clear. Orbits appear normal. Mastoids are clear. Other: None significant CTA NECK FINDINGS Aortic arch: Aortic atherosclerosis. Branching pattern is normal. No origin stenosis. Right carotid system: Common carotid artery widely patent to the bifurcation. Carotid bifurcation is normal without soft or calcified plaque. No narrowing or irregularity. Cervical ICA is tortuous. Pronounced irregularity in the midportion of the cervical ICA consistent with fibromuscular disease. There is dissection of the upper cervical ICA with complex irregular intimal flaps. This could certainly be a cause of embolic disease. Left carotid system: Common carotid artery widely patent to the bifurcation. No atherosclerotic disease at the carotid bifurcation. Cervical ICA is tortuous with some irregularity consistent with fibromuscular disease. Similarly, there is a  localized dissection in the upper cervical ICA, which could be a source of embolic disease. Vertebral arteries: Both vertebral artery origins are widely patent. Both vertebral arteries appear widely patent through the cervical region. No sign of vertebral fibromuscular disease. Skeleton: Ordinary cervical spondylosis. Old appearing compression fracture noted at T1 and T3. Other neck: No mass or lymphadenopathy. Upper chest: Chronic nodal prominence in the superior mediastinum, similar to the CT scan of 2018, therefore likely benign. Review of the MIP images confirms the above findings CTA HEAD FINDINGS Anterior circulation: Both internal carotid arteries are widely patent through the skull base and siphon regions. The anterior and middle cerebral arteries are patent without proximal stenosis, aneurysm or vascular malformation. Posterior circulation: Both vertebral arteries are widely patent to the basilar. No basilar stenosis. Posterior circulation branch  vessels are patent and normal. Venous sinuses: Patent and normal. Anatomic variants: None significant. 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 Areas of irregular pseudoaneurysm formation without a dominant pseudoaneurysm. These abnormalities could be a cause of embolic disease. No large or medium vessel occlusion is identified with respect to the intracranial circulation. Report will be called to the provider's office upon opening. Electronically Signed   By: Paulina FusiMark  Shogry M.D.   On: 12/14/2018 12:34   CT ANGIO NECK W OR WO CONTRAST  Result Date: 12/14/2018 CLINICAL DATA:  Left facial tingling for the last several months. EXAM: CT ANGIOGRAPHY HEAD AND NECK TECHNIQUE: Multidetector CT imaging of the head and neck was performed using the standard  protocol during bolus administration of intravenous contrast. Multiplanar CT image reconstructions and MIPs were obtained to evaluate the vascular anatomy. Carotid stenosis measurements (when applicable) are obtained utilizing NASCET criteria, using the distal internal carotid diameter as the denominator. CONTRAST:  75mL ISOVUE-370 IOPAMIDOL (ISOVUE-370) INJECTION 76% COMPARISON:  MRI 10/29/2018 FINDINGS: CT HEAD FINDINGS Brain: The brain shows a normal appearance without evidence of malformation, atrophy, old or acute small or large vessel infarction, mass lesion, hemorrhage, hydrocephalus or extra-axial collection. Vascular: Minimal atherosclerotic change of the major vessels at the base brain. Skull: Normal.  No traumatic finding.  No focal bone lesion. Sinuses/Orbits: Sinuses are clear. Orbits appear normal. Mastoids are clear. Other: None significant CTA NECK FINDINGS Aortic arch: Aortic atherosclerosis. Branching pattern is normal. No origin stenosis. Right carotid system: Common carotid artery widely patent to the bifurcation. Carotid bifurcation is normal without soft or calcified plaque. No narrowing or irregularity. Cervical ICA is tortuous. Pronounced irregularity in the midportion of the cervical ICA consistent with fibromuscular disease. There is dissection of the upper cervical ICA with complex irregular intimal flaps. This could certainly be a cause of embolic disease. Left carotid system: Common carotid artery widely patent to the bifurcation. No atherosclerotic disease at the carotid bifurcation. Cervical ICA is tortuous with some irregularity consistent with fibromuscular disease. Similarly, there is a localized dissection in the upper cervical ICA, which could be a source of embolic disease. Vertebral arteries: Both vertebral artery origins are widely patent. Both vertebral arteries appear widely patent through the cervical region. No sign of vertebral fibromuscular disease. Skeleton: Ordinary  cervical spondylosis. Old appearing compression fracture noted at T1 and T3. Other neck: No mass or lymphadenopathy. Upper chest: Chronic nodal prominence in the superior mediastinum, similar to the CT scan of 2018, therefore likely benign. Review of the MIP images confirms the above findings CTA HEAD FINDINGS Anterior circulation: Both internal carotid arteries are widely patent through the skull base and siphon regions. The anterior and middle cerebral arteries are patent without proximal stenosis, aneurysm or vascular malformation. Posterior circulation: Both vertebral arteries are widely patent to the basilar. No basilar stenosis. Posterior circulation branch vessels are patent and normal. Venous sinuses: Patent and normal. Anatomic variants: None significant. 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 Areas of irregular pseudoaneurysm formation without a dominant pseudoaneurysm. These abnormalities could be a cause of embolic disease. No large or medium vessel occlusion is identified with respect to the  intracranial circulation. Report will be called to the provider's office upon opening. Electronically Signed   By: Paulina Fusi M.D.   On: 12/14/2018 12:34   IR US Guide Vasc Access Right  Result Date: 12/25/2018 CLINICAL DATA:  Left-sided facial numbness and paresthesias. Abnormal CT angiogram of the head and neck. EXAM: IR ULTRASOUND GUIDANCE VASC ACCESS RIGHT; BILATERAL COMMON CAROTID AND INNOMINATE ANGIOGRAPHY; IR ANGIO VERTEBRAL SEL SUBCLAVIAN INNOMINATE UNI RIGHT MOD SED; IR ANGIO VERTEBRAL SEL VERTEBRAL UNI LEFT MOD SED COMPARISON:  CT angiogram of the head and neck of December 13, 2018. MEDICATIONS: Heparin 2000 units IV; no antibiotic was administered within 1 hour of the procedure.  ANESTHESIA/SEDATION: Versed 1 mg IV; Fentanyl 25 mcg IV Moderate Sedation Time:  51 minutes The patient was continuously monitored during the procedure by the interventional radiology nurse under my direct supervision. CONTRAST:  Isovue 300 approximately 70 mL. FLUOROSCOPY TIME:  Fluoroscopy Time: 15 minutes 6 seconds (1172 mGy). COMPLICATIONS: None immediate. TECHNIQUE: Informed written consent was obtained from the patient after a thorough discussion of the procedural risks, benefits and alternatives. All questions were addressed. Maximal Sterile Barrier Technique was utilized including caps, mask, sterile gowns, sterile gloves, sterile drape, hand hygiene and skin antiseptic. A timeout was performed prior to the initiation of the procedure. The right forearm to the wrist was prepped and draped in the usual sterile fashion. The right radial artery was then palpated. A dorsal palmar anastomosis was verified. Using ultrasound guidance, the morphology of the right radial artery was then obtained and documented. Using ultrasound guidance, trans radial access into the right radial artery was obtained with a micropuncture set. Over a 0.018 inch micro guidewire, a 4/5 French radial sheath was then inserted. The obturator and the micro guidewire were removed. Good aspiration was obtained from the side port of the sheath. A right radial arteriogram was then performed. Over a 0.035 inch Roadrunner guidewire, a 5 [place] Simmons 2 diagnostic catheter was then advanced to the aortic arch region and arteriogram was then performed at the origin of the right vertebral artery, the right common carotid artery, the left common carotid artery and the left vertebral artery. Following the procedure, hemostasis in the right wrist puncture site was obtained with a wrist band. The distal right radial pulse was verified to be present. FINDINGS: The origin of the right radial artery is widely patent. The vessel is seen to opacify to the  cranial skull base. Wide patency is seen of the right vertebrobasilar junction and the right posterior-inferior cerebellar artery. More distally, the right vertebrobasilar junction and the opacified portion of the basilar artery, the superior cerebellar arteries and the anterior-inferior cerebellar arteries is noted to be present. Unopacified blood is seen in the basilar artery from the contralateral vertebral artery. The origin of the left vertebral artery is widely patent. The vessel is seen to opacify to the cranial skull base. This is the dominant vertebral artery. The left posterior-inferior cerebellar artery and the distal left vertebrobasilar junction are seen to be patent. The opacified portions of the basilar artery, the posterior cerebral arteries, the superior cerebellar arteries and the anterior-inferior cerebellar arteries demonstrate wide patency. Proximal to the superior cerebellar arteries there is approximately 50% stenosis. A right common carotid arteriogram demonstrates the external carotid artery and its major branches to be widely patent. The right internal carotid artery at the bulb and its proximal 1/3 is widely patent. There is a segmental area of fusiform dilatation with a focal outpouching at  the junction of the distal and middle 1/3 of the right internal carotid artery. This is associated with an eccentric flame shaped outpouching measuring approximately 13.6 mm x 5.6 mm consistent with a pseudoaneurysm. There is associated stenosis along the lateral aspect of the right internal carotid artery which demonstrates fusiform dilatation measuring approximately 22.5 mm. Distal to this the petrous, the cavernous and the supraclinoid right ICA demonstrate wide patency. The right middle cerebral artery opacifies into the capillary and venous phases. Non opacification of the right anterior cerebral A1 segment is seen. The left common carotid arteriogram demonstrates the left external carotid artery  and its major branches to be widely patent. The left internal carotid artery at the bulb and its proximal 1/3 is widely patent. There is a fusiform dilatation of the distal left internal carotid artery cervical region associated with a flame-shaped outpouching projecting superiorly. This measures approximately 11.9 mm x 5.9 mm. More distally, the cervical petrous, petrous cavernous, and the supraclinoid segments are widely patent. The left middle cerebral artery and the left anterior cerebral artery opacify into the capillary and venous phases. Cross-filling via the anterior communicating artery of the right anterior cerebral A2 segment and distally is seen. IMPRESSION: Approximately 13.6 mm x 5.6 mm outpouching of the right internal carotid artery at the junction of the cervical and middle 1/3 associated with fusiform dilatation of a total of 22.5 mm. 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 Underlying fibromuscular dysplasia by a differential consideration versus trauma versus spontaneous dissections associated with pseudoaneurysm formation. PLAN: The above findings were reviewed with the patient. The patient was advised to continue taking his present medications of baby aspirin. Discussion regarding the above angiographic findings to be scheduled as an outpatient. Patient concurs with the above management plan. Electronically Signed   By: Julieanne Cotton M.D.   On: 12/24/2018 14:29   IR ANGIO INTRA EXTRACRAN SEL COM CAROTID INNOMINATE BILAT MOD SED  Result Date: 12/25/2018 CLINICAL DATA:  Left-sided facial numbness and paresthesias. Abnormal CT angiogram of the head and neck. EXAM: IR ULTRASOUND GUIDANCE VASC ACCESS RIGHT; BILATERAL  COMMON CAROTID AND INNOMINATE ANGIOGRAPHY; IR ANGIO VERTEBRAL SEL SUBCLAVIAN INNOMINATE UNI RIGHT MOD SED; IR ANGIO VERTEBRAL SEL VERTEBRAL UNI LEFT MOD SED COMPARISON:  CT angiogram of the head and neck of December 13, 2018. MEDICATIONS: Heparin 2000 units IV; no antibiotic was administered within 1 hour of the procedure. ANESTHESIA/SEDATION: Versed 1 mg IV; Fentanyl 25 mcg IV Moderate Sedation Time:  51 minutes The patient was continuously monitored during the procedure by the interventional radiology nurse under my direct supervision. CONTRAST:  Isovue 300 approximately 70 mL. FLUOROSCOPY TIME:  Fluoroscopy Time: 15 minutes 6 seconds (1172 mGy). COMPLICATIONS: None immediate. TECHNIQUE: Informed written consent was obtained from the patient after a thorough discussion of the procedural risks, benefits and alternatives. All questions were addressed. Maximal Sterile Barrier Technique was utilized including caps, mask, sterile gowns, sterile gloves, sterile drape, hand hygiene and skin antiseptic. A timeout was performed prior to the initiation of the procedure. The right forearm to the wrist was prepped and draped in the usual sterile fashion. The right radial artery was then palpated. A dorsal palmar anastomosis was verified. Using ultrasound guidance, the morphology of the right radial artery was then obtained and documented. Using ultrasound guidance, trans  radial access into the right radial artery was obtained with a micropuncture set. Over a 0.018 inch micro guidewire, a 4/5 French radial sheath was then inserted. The obturator and the micro guidewire were removed. Good aspiration was obtained from the side port of the sheath. A right radial arteriogram was then performed. Over a 0.035 inch Roadrunner guidewire, a 5 [place] Simmons 2 diagnostic catheter was then advanced to the aortic arch region and arteriogram was then performed at the origin of the right vertebral artery, the right common carotid artery, the  left common carotid artery and the left vertebral artery. Following the procedure, hemostasis in the right wrist puncture site was obtained with a wrist band. The distal right radial pulse was verified to be present. FINDINGS: The origin of the right radial artery is widely patent. The vessel is seen to opacify to the cranial skull base. Wide patency is seen of the right vertebrobasilar junction and the right posterior-inferior cerebellar artery. More distally, the right vertebrobasilar junction and the opacified portion of the basilar artery, the superior cerebellar arteries and the anterior-inferior cerebellar arteries is noted to be present. Unopacified blood is seen in the basilar artery from the contralateral vertebral artery. The origin of the left vertebral artery is widely patent. The vessel is seen to opacify to the cranial skull base. This is the dominant vertebral artery. The left posterior-inferior cerebellar artery and the distal left vertebrobasilar junction are seen to be patent. The opacified portions of the basilar artery, the posterior cerebral arteries, the superior cerebellar arteries and the anterior-inferior cerebellar arteries demonstrate wide patency. Proximal to the superior cerebellar arteries there is approximately 50% stenosis. A right common carotid arteriogram demonstrates the external carotid artery and its major branches to be widely patent. The right internal carotid artery at the bulb and its proximal 1/3 is widely patent. There is a segmental area of fusiform dilatation with a focal outpouching at the junction of the distal and middle 1/3 of the right internal carotid artery. This is associated with an eccentric flame shaped outpouching measuring approximately 13.6 mm x 5.6 mm consistent with a pseudoaneurysm. There is associated stenosis along the lateral aspect of the right internal carotid artery which demonstrates fusiform dilatation measuring approximately 22.5 mm. Distal to  this the petrous, the cavernous and the supraclinoid right ICA demonstrate wide patency. The right middle cerebral artery opacifies into the capillary and venous phases. Non opacification of the right anterior cerebral A1 segment is seen. The left common carotid arteriogram demonstrates the left external carotid artery and its major branches to be widely patent. The left internal carotid artery at the bulb and its proximal 1/3 is widely patent. There is a fusiform dilatation of the distal left internal carotid artery cervical region associated with a flame-shaped outpouching projecting superiorly. This measures approximately 11.9 mm x 5.9 mm. More distally, the cervical petrous, petrous cavernous, and the supraclinoid segments are widely patent. The left middle cerebral artery and the left anterior cerebral artery opacify into the capillary and venous phases. Cross-filling via the anterior communicating artery of the right anterior cerebral A2 segment and distally is seen. IMPRESSION: Approximately 13.6 mm x 5.6 mm outpouching of the right internal carotid artery at the junction of the cervical and middle 1/3 associated with fusiform dilatation of a total of 22.5 mm. 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 Findings above bilaterally most  consistent with sequela of dissections associated with pseudoaneurysm formation bilaterally. Underlying fibromuscular dysplasia by a differential consideration versus trauma versus spontaneous dissections associated with pseudoaneurysm formation. PLAN: The above findings were reviewed with the patient. The patient was advised to continue taking his present medications of baby aspirin. Discussion regarding the above angiographic findings to be scheduled as an outpatient. Patient concurs with the above management plan. Electronically  Signed   By: Julieanne Cotton M.D.   On: 12/24/2018 14:29    Labs:  CBC: Recent Labs    12/24/18 0837  WBC 7.7  HGB 16.3  HCT 48.9  PLT 209    COAGS: Recent Labs    12/24/18 0837  INR 1.0    BMP: Recent Labs    12/24/18 0837  NA 141  K 4.5  CL 106  CO2 24  GLUCOSE 115*  BUN 13  CALCIUM 9.0  CREATININE 1.08  GFRNONAA >60  GFRAA >60    LIVER FUNCTION TESTS: No results for input(s): BILITOT, AST, ALT, ALKPHOS, PROT, ALBUMIN in the last 8760 hours.  TUMOR MARKERS: No results for input(s): AFPTM, CEA, CA199, CHROMGRNA in the last 8760 hours.  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 NIR was consulted for further evaluation and patient underwent diagnostic angiogram on 12/24/18 which showed an approximately 13.6 mm x 5.6 mm outpouching of the right internal carotid artery at the junction of the cervical and middle 1/3 associated with fusiform dilatation of a total of 22.5 mm. 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 Underlying fibromuscular dysplasia by a differential consideration versus trauma versus spontaneous dissections associated with pseudoaneurysm formation. He presents today with his wife to discuss these results and care plan moving forward.  Dr. Corliss Skains was present for consultation. Discussed the  nature of pseudoaneurysm development due to FMD, dissection 2/2 injury or spontaneous dissection and that it is felt that his pseudoaneurysm was less likely to be caused by FMD and more likely 2/2 spontaneous dissection. Reviewed the location and size of the pseudoaneurysms seen on angiogram and that it is unlikely that these areas would rupture, however there is concern that these areas could enlarge. Discussed that it is unclear if his symptoms are related to these pseudoaneurysms, however his symptoms may be related to nerve irritation caused by the left sided pseudoaneurysm. Reviewed again that no evidence of stroke or hemorrhage was found on imaging and that this is not the cause of his symptoms.   Discussed in detail options for further management - specifically proceeding with endovascular intervention vs conservative management (starting Plavix and follow up imaging). We discussed that if the patient remains clinically unchanged it may be best to continue to monitor the pseudoaneurysms using routine imaging, however if his symptoms worsen or he develops new symptoms we would likely need to proceed with endovascular intervention under general anesthesia. After thorough discussion of all options Mr. Nehme and his wife have elected to pursue conservative management at this time.  Plan for follow-up:  1. Start Plavix 75 mg PO QD (paper rx given to patient's wife today per her request). Monitor for s/s of abnormal bleeding and call our office if this is noted. 2. Come to hospital lab early January at patient's convenience for p2y12 to assess response to Plavix 3. CTA head/neck in 4 months from date of diagnostic angiogram 4. Avoid heavy lifting or activities which require twisting motions (such as golf/tennis) 5. Follow up with PCP regarding elevated BP 6. Continue ASA 81 mg QD as well as all other prescribed medicines 7. Drink plenty of water 8. Call our office with any questions or concerns.    Informed patient that our schedulers will call with appointment date/time of CTA head/neck.  Return precautions reviewed today which patient and his wife state understanding to.  All questions answered and concerns addressed. Patient conveys understanding and agrees with plan.  Thank you for this interesting consult.  I greatly enjoyed meeting JEKHI BOLIN and look forward to participating in their care.  A copy of this report was sent to the requesting provider on this date.  Electronically Signed: Villa Herb, PA-C 12/26/2018, 2:55 PM   I spent a total of  40 Minutes in face to face in clinical consultation, greater than 50% of which was counseling/coordinating care for angiogram follow up.

## 2019-01-14 ENCOUNTER — Other Ambulatory Visit (HOSPITAL_COMMUNITY): Payer: Self-pay | Admitting: Interventional Radiology

## 2019-01-31 ENCOUNTER — Telehealth (HOSPITAL_COMMUNITY): Payer: Self-pay | Admitting: Radiology

## 2019-01-31 NOTE — Telephone Encounter (Signed)
Tried to return pt's call for follow-up. No answer, no VM. JM

## 2019-03-03 ENCOUNTER — Encounter: Payer: Self-pay | Admitting: Neurology

## 2019-03-03 ENCOUNTER — Ambulatory Visit (INDEPENDENT_AMBULATORY_CARE_PROVIDER_SITE_OTHER): Payer: Medicare Other | Admitting: Neurology

## 2019-03-03 ENCOUNTER — Other Ambulatory Visit: Payer: Self-pay

## 2019-03-03 VITALS — BP 149/83 | HR 72 | Resp 18 | Ht 71.0 in | Wt 198.2 lb

## 2019-03-03 DIAGNOSIS — I773 Arterial fibromuscular dysplasia: Secondary | ICD-10-CM | POA: Diagnosis not present

## 2019-03-03 NOTE — Progress Notes (Signed)
Follow-up Visit   Date: 03/03/19   Bruce Watts MRN: [id number] DOB: 1945/08/06   Interim History: Bruce Watts is a 74 y.o. right-handed Caucasian male with asthma, GERD, hyperlipidemia returning to the clinic for follow-up of fibromuscular disease.  The patient was accompanied to the clinic by self.  History of present illness: Starting around April/May 2020, he noticed numbness over the left side of the face which has remained constant since onset.  He also developed tingling over the left arm and leg which lasts a few hours and occurs several times per week.  He denies any specific trigger.  He denies vision changes, headache, arm or leg weakness. MRI brain was performed in October, which shows mild microvascular changes, no evidence of stroke.    UPDATE 03/03/2019:  He is here for follow-up visit.  He has marked improvement degree of numbness over the left side of the face, where he only notices it a few times per week. No tingling/numbness in the left arm or leg.  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  He is compliant with aspirin, plavix 75mg , statin, and BP medications. No new complaint today.   Medications:  Current Outpatient Medications on File Prior to Visit  Medication Sig Dispense Refill  . amLODipine (NORVASC) 5 MG tablet Take 5 mg by mouth daily.    Marland Kitchen aspirin EC 81 MG tablet Take 81 mg by mouth daily.    . clopidogrel (PLAVIX) 75 MG tablet Take 75 mg by mouth daily.    . fluticasone furoate-vilanterol (BREO ELLIPTA) 200-25 MCG/INH AEPB Inhale 1 puff into the lungs daily.    Marland Kitchen omeprazole (PRILOSEC) 40 MG capsule Take 40 mg by mouth daily.    . pravastatin (PRAVACHOL) 20 MG tablet Take 20 mg by mouth daily.    . sildenafil (VIAGRA) 100 MG tablet Take 100 mg by mouth daily as needed for erectile dysfunction.     No current facility-administered medications  on file prior to visit.    Allergies: No Known Allergies  Vital Signs:  BP (!) 149/83 (BP Location: Left Arm, Patient Position: Sitting)   Pulse 72   Resp 18   Ht 5\' 11"  (1.803 m)   Wt 198 lb 3.2 oz (89.9 kg)   SpO2 (!) 72%   BMI 27.64 kg/m   Neurological Exam: MENTAL STATUS including orientation to time, place, person, recent and remote memory, attention span and concentration, language, and fund of knowledge is normal.  Speech is not dysarthric.  CRANIAL NERVES:  No visual field defects.  Pupils equal round and reactive to light.  Normal conjugate, extra-ocular eye movements in all directions of gaze.  No ptosis.  Face is symmetric. Sensation on the face is normal. Palate elevates symmetrically.  Tongue is midline.  MOTOR:  Motor strength is 5/5 in all extremities.  No drift.   SENSORY:  Intact to vibration and pin prick throughout.  COORDINATION/GAIT: Gait narrow based and stable.   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 Areas of irregular pseudoaneurysm formation without a dominant pseudoaneurysm. These abnormalities could be a cause of embolic disease. No large or medium vessel occlusion is identified with respect to the intracranial circulation.  Cerebral angiogram 12/24/2018: Approximately 13.6 mm x 5.6 mm  outpouching of the right internal carotid artery at the junction of the cervical and middle 1/3 associated with fusiform dilatation of a total of 22.5 mm.  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 Underlying  fibromuscular dysplasia by a differential consideration versus trauma versus spontaneous dissections associated with pseudoaneurysm formation.   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  He reports only rare spells of left facial paresthesia  - He underwent cerebral angiogram by Dr. Corliss Skains which showed bilateral ICA distal pseudoaneurysm.   - No further follow-up was scheduled with IR, my office will reach out to schedule  - Continue medical management with BP control which he is monitoring at home. Today, BP is mildly elevated at 149/83.    - Continue aspirin 81mg  and plabic 75mg  daily  Return to clinic in 6 months.   Thank you for allowing me to participate in patient's care.  If I can answer any additional questions, I would be pleased to do so.    Sincerely,    Inetha Maret K. , DO

## 2019-03-03 NOTE — Patient Instructions (Signed)
Return to clinic in 6 months.

## 2019-04-02 ENCOUNTER — Other Ambulatory Visit (HOSPITAL_COMMUNITY): Payer: Self-pay | Admitting: Physician Assistant

## 2019-04-02 MED ORDER — CLOPIDOGREL BISULFATE 75 MG PO TABS: 75.0000 mg | ORAL_TABLET | Freq: Every day | ORAL | 6 refills | Status: AC

## 2019-05-12 ENCOUNTER — Other Ambulatory Visit (HOSPITAL_COMMUNITY): Payer: Self-pay | Admitting: Interventional Radiology

## 2019-05-12 DIAGNOSIS — G459 Transient cerebral ischemic attack, unspecified: Secondary | ICD-10-CM

## 2019-05-21 ENCOUNTER — Ambulatory Visit (HOSPITAL_COMMUNITY)
Admission: RE | Admit: 2019-05-21 | Discharge: 2019-05-21 | Disposition: A | Discharge status: Home / Self Care | Payer: Medicare Other | Source: Ambulatory Visit | Attending: Interventional Radiology | Admitting: Interventional Radiology

## 2019-05-21 ENCOUNTER — Other Ambulatory Visit: Payer: Self-pay

## 2019-05-21 DIAGNOSIS — G459 Transient cerebral ischemic attack, unspecified: Secondary | ICD-10-CM | POA: Insufficient documentation

## 2019-05-21 LAB — POCT I-STAT CREATININE: Creatinine, Ser: 1.1 mg/dL (ref 0.61–1.24)

## 2019-05-21 MED ORDER — IOHEXOL 350 MG/ML SOLN
75.0000 mL | Freq: Once | INTRAVENOUS | Status: AC | PRN
  Administered 2019-05-21: 75 mL via INTRAVENOUS

## 2019-05-26 ENCOUNTER — Telehealth (HOSPITAL_COMMUNITY): Payer: Self-pay

## 2019-05-26 NOTE — Telephone Encounter (Signed)
Pt agreed to f/u in 6 months with cta head/neck. AW 

## 2019-09-01 ENCOUNTER — Ambulatory Visit: Payer: Medicare Other | Admitting: Neurology

## 2019-10-30 ENCOUNTER — Ambulatory Visit: Payer: Medicare Other | Admitting: Neurology

## 2019-11-06 DIAGNOSIS — I361 Nonrheumatic tricuspid (valve) insufficiency: Secondary | ICD-10-CM | POA: Diagnosis not present

## 2019-11-06 DIAGNOSIS — I351 Nonrheumatic aortic (valve) insufficiency: Secondary | ICD-10-CM | POA: Diagnosis not present

## 2019-12-12 ENCOUNTER — Ambulatory Visit (INDEPENDENT_AMBULATORY_CARE_PROVIDER_SITE_OTHER): Payer: Medicare Other | Admitting: Neurology

## 2019-12-12 ENCOUNTER — Encounter: Payer: Self-pay | Admitting: Neurology

## 2019-12-12 ENCOUNTER — Other Ambulatory Visit: Payer: Self-pay

## 2019-12-12 VITALS — BP 149/75 | HR 77 | Ht 71.0 in | Wt 190.8 lb

## 2019-12-12 DIAGNOSIS — I951 Orthostatic hypotension: Secondary | ICD-10-CM

## 2019-12-12 DIAGNOSIS — I773 Arterial fibromuscular dysplasia: Secondary | ICD-10-CM | POA: Diagnosis not present

## 2019-12-12 MED ORDER — CLOPIDOGREL BISULFATE 75 MG PO TABS: 75.0000 mg | ORAL_TABLET | Freq: Every day | ORAL | 3 refills | Status: DC

## 2019-12-12 NOTE — Progress Notes (Signed)
Follow-up Visit   Date: 12/12/19   Bruce Watts MRN: [id number] DOB: 17-Jan-1945   Interim History: Bruce Watts is a 74 y.o. right-handed Caucasian male with asthma, GERD, hyperlipidemia returning to the clinic for follow-up of fibromuscular disease.  The patient was accompanied to the clinic by self.  History of present illness: Starting around April/May 2020, he noticed numbness over the left side of the face which has remained constant since onset.  He also developed tingling over the left arm and leg which lasts a few hours and occurs several times per week.  He denies any specific trigger.  He denies vision changes, headache, arm or leg weakness. MRI brain was performed in October, which shows mild microvascular changes, no evidence of stroke.    UPDATE 03/03/2019:  He is here for follow-up visit.  He has marked improvement degree of numbness over the left side of the face, where he only notices it a few times per week. No tingling/numbness in the left arm or leg.  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  He is compliant with aspirin, plavix 75mg , statin, and BP medications. No new complaint today.   UPDATE 12/12/2019:  He has noticed lightheadeness with positional changes.  He continues to have spells of tingling over the left side of the face, which occur a few times per week (unchanged). CTA from May 2021 shows stable cervical ICA dissection and pseudoaneurysm, consistent with fibromuscular dysplasia.  No new neurological complaints.  He is requesting refills for plavix.    Medications:  Current Outpatient Medications on File Prior to Visit  Medication Sig Dispense Refill  . amLODipine (NORVASC) 5 MG tablet Take 5 mg by mouth daily.    June 2021 aspirin EC 81 MG tablet Take 81 mg by mouth daily.    . clopidogrel (PLAVIX) 75 MG tablet Take 75 mg by mouth daily.    .  fluticasone furoate-vilanterol (BREO ELLIPTA) 200-25 MCG/INH AEPB Inhale 1 puff into the lungs daily.    Marland Kitchen omeprazole (PRILOSEC) 40 MG capsule Take 40 mg by mouth daily.    . pravastatin (PRAVACHOL) 20 MG tablet Take 20 mg by mouth daily.     No current facility-administered medications on file prior to visit.    Allergies: No Known Allergies  Vital Signs:  BP (!) 149/75   Pulse 77   Ht 5\' 11"  (1.803 m)   Wt 190 lb 12.8 oz (86.5 kg)   SpO2 98%   BMI 26.61 kg/m   Neurological Exam: MENTAL STATUS including orientation to time, place, person, recent and remote memory, attention span and concentration, language, and fund of knowledge is normal.  Speech is not dysarthric.  CRANIAL NERVES:  No visual field defects.  Pupils equal round and reactive to light.  Normal conjugate, extra-ocular eye movements in all directions of gaze.  No ptosis.  Face is symmetric. Sensation on the face is normal. Palate elevates symmetrically.  Tongue is midline.  MOTOR:  Motor strength is 5/5 in all extremities.  No drift.   SENSORY:  Intact to vibration and pin prick throughout.  COORDINATION/GAIT: Gait narrow based and stable.   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 Areas of irregular pseudoaneurysm formation without a dominant pseudoaneurysm. These abnormalities  could be a cause of embolic disease. No large or medium vessel occlusion is identified with respect to the intracranial circulation.  Cerebral angiogram 12/24/2018: Approximately 13.6 mm x 5.6 mm outpouching of the right internal carotid artery at the junction of the cervical and middle 1/3 associated with fusiform dilatation of a total of 22.5 mm.  Approximately 11.9 mm x 5.9 mm saccular outpouching projecting superiorly arising in the distal cervical  ICA most consistent with a pseudoaneurysm associated with moderate fusiform dilatation. 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 Underlying fibromuscular dysplasia by a differential consideration versus trauma versus spontaneous dissections associated with pseudoaneurysm formation.  CT/A head and neck 05/21/2019: 1. Stable abnormal cervical ICAs with unchanged dissections and pseudoaneurysms since December.  2. 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  3. 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 No arterial stenosis, intracranial aneurysm, or new arterial abnormality is identified in the head or neck.  4. Stable and normal for age CT appearance of the brain.  5. Chronic T3 compression fracture.   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  He reports only rare spells of left facial paresthesia.  CTA head and neck from 05/2019 shows stable bilateral ICA distal pseudoaneurysm.   - Continue aspirin 81mg  and plavix 75mg .  Refills provided  - Continue BP control  Positional lightheadedness, likely due to mild orthostasis  - Educated patient to make positional changes slowly  Return to clinic in 1 year  Thank you for allowing me to participate in patient's care.  If I can answer any additional questions, I would be pleased to do so.    Sincerely,    Dannya Pitkin K. , DO

## 2019-12-12 NOTE — Patient Instructions (Signed)
Stay well-hydrated Make positional changes slowly to avoid getting lightheaded Continue your medications as you are taking  Return to clinic in 1 year.

## 2020-02-25 ENCOUNTER — Telehealth (HOSPITAL_COMMUNITY): Payer: Self-pay

## 2020-02-25 NOTE — Telephone Encounter (Signed)
Called to schedule cta head/neck, no answer, left vm. AW 

## 2020-02-27 ENCOUNTER — Other Ambulatory Visit (HOSPITAL_COMMUNITY): Payer: Self-pay | Admitting: Interventional Radiology

## 2020-02-27 DIAGNOSIS — G459 Transient cerebral ischemic attack, unspecified: Secondary | ICD-10-CM

## 2020-03-10 ENCOUNTER — Ambulatory Visit (HOSPITAL_COMMUNITY)
Admission: RE | Admit: 2020-03-10 | Discharge: 2020-03-10 | Disposition: A | Discharge status: Home / Self Care | Payer: Medicare Other | Source: Ambulatory Visit | Attending: Interventional Radiology | Admitting: Interventional Radiology

## 2020-03-10 ENCOUNTER — Other Ambulatory Visit: Payer: Self-pay

## 2020-03-10 DIAGNOSIS — G459 Transient cerebral ischemic attack, unspecified: Secondary | ICD-10-CM | POA: Insufficient documentation

## 2020-03-10 LAB — POCT I-STAT CREATININE: Creatinine, Ser: 1.2 mg/dL (ref 0.61–1.24)

## 2020-03-10 MED ORDER — IOHEXOL 350 MG/ML SOLN
75.0000 mL | Freq: Once | INTRAVENOUS | Status: AC | PRN
  Administered 2020-03-10: 75 mL via INTRAVENOUS

## 2020-03-15 ENCOUNTER — Telehealth (HOSPITAL_COMMUNITY): Payer: Self-pay

## 2020-03-15 NOTE — Telephone Encounter (Signed)
Pt agreed to f/u in 6 months with cta head/neck. AW 

## 2020-10-25 ENCOUNTER — Other Ambulatory Visit: Payer: Self-pay | Admitting: Neurology

## 2020-12-13 ENCOUNTER — Ambulatory Visit: Payer: Medicare Other | Admitting: Neurology

## 2020-12-24 ENCOUNTER — Ambulatory Visit: Payer: Medicare Other | Admitting: Neurology

## 2020-12-24 ENCOUNTER — Encounter: Payer: Self-pay | Admitting: Neurology

## 2020-12-24 ENCOUNTER — Other Ambulatory Visit: Payer: Self-pay

## 2020-12-24 VITALS — BP 130/84 | HR 87 | Ht 71.0 in | Wt 196.0 lb

## 2020-12-24 DIAGNOSIS — I773 Arterial fibromuscular dysplasia: Secondary | ICD-10-CM | POA: Diagnosis not present

## 2020-12-24 MED ORDER — CLOPIDOGREL BISULFATE 75 MG PO TABS: 75.0000 mg | ORAL_TABLET | Freq: Every day | ORAL | 3 refills | Status: DC

## 2020-12-24 NOTE — Patient Instructions (Signed)
Return to clinic in [place]

## 2020-12-24 NOTE — Progress Notes (Signed)
Follow-up Visit   Date: 12/24/20   Bruce Watts MRN: [id number] DOB: [birth year]   Interim History: Bruce Watts is a 75 y.o. right-handed Caucasian male with asthma, GERD, hyperlipidemia returning to the clinic for follow-up of fibromuscular disease.  The patient was accompanied to the clinic by self.  History of present illness: Starting around April/May 2020, he noticed numbness over the left side of the face which has remained constant since onset.  He also developed tingling over the left arm and leg which lasts a few hours and occurs several times per week.  He denies any specific trigger.  He denies vision changes, headache, arm or leg weakness. MRI brain was performed in October, which shows mild microvascular changes, no evidence of stroke.    UPDATE 03/03/2019:  He is here for follow-up visit.  He has marked improvement degree of numbness over the left side of the face, where he only notices it a few times per week. No tingling/numbness in the left arm or leg.  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  He is compliant with aspirin, plavix 75mg , statin, and BP medications. No new complaint today.   UPDATE 12/12/2019:  He has noticed lightheadeness with positional changes.  He continues to have spells of tingling over the left side of the face, which occur a few times per week (unchanged). CTA from May 2021 shows stable cervical ICA dissection and pseudoaneurysm, consistent with fibromuscular dysplasia.  No new neurological complaints.  He is requesting refills for plavix.  UPDATE 12/24/2020: He has been doing well.  He no longer has any left facial paresthesias. No new neurological complaints. He has been compliant with ASA, plavix, statin therapy, and BP medications.   Lightheadedness has resolved.    Medications:  Current Outpatient Medications on File Prior to  Visit  Medication Sig Dispense Refill   amLODipine (NORVASC) 5 MG tablet Take 5 mg by mouth daily.     aspirin EC 81 MG tablet Take 81 mg by mouth daily.     clopidogrel (PLAVIX) 75 MG tablet TAKE 1 TABLET(75 MG) BY MOUTH DAILY 90 tablet 0   fluticasone furoate-vilanterol (BREO ELLIPTA) 200-25 MCG/INH AEPB Inhale 1 puff into the lungs daily.     omeprazole (PRILOSEC) 40 MG capsule Take 40 mg by mouth daily.     pravastatin (PRAVACHOL) 20 MG tablet Take 20 mg by mouth daily.     No current facility-administered medications on file prior to visit.    Allergies: No Known Allergies  Vital Signs:  BP 130/84    Pulse 87    Ht 5\' 11"  (1.803 m)    Wt 196 lb (88.9 kg)    SpO2 95%    BMI 27.34 kg/m   Neurological Exam: MENTAL STATUS including orientation to time, place, person, recent and remote memory, attention span and concentration, language, and fund of knowledge is normal.  Speech is not dysarthric.  CRANIAL NERVES:  No visual field defects.  Pupils equal round and reactive to light.  Normal conjugate, extra-ocular eye movements in all directions of gaze.  No ptosis.  Face is symmetric. Sensation on the face is normal.   MOTOR:  Motor strength is 5/5 in all extremities.  No drift.   SENSORY:  Intact to vibration throughout.  COORDINATION/GAIT: Gait narrow based and stable.   Data: CT/A head and neck 12/14/2018: Fibromuscular disease of both cervical  internal carotid arteries with irregular narrowing and ectasia. 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 Areas of irregular pseudoaneurysm formation without a dominant pseudoaneurysm. These abnormalities could be a cause of embolic disease. No large or medium vessel occlusion is identified with respect to the intracranial circulation.   Cerebral angiogram 12/24/2018: Approximately 13.6 mm x 5.6 mm outpouching of the right internal carotid artery at the junction of the cervical and middle  1/3 associated with fusiform dilatation of a total of 22.5 mm.   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 Underlying fibromuscular dysplasia by a differential consideration versus trauma versus spontaneous dissections associated with pseudoaneurysm formation.   CT/A head and neck 05/21/2019: 1. Stable abnormal cervical ICAs with unchanged dissections and pseudoaneurysms since December.   2. 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   3. 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 No arterial stenosis, intracranial aneurysm, or new arterial abnormality is identified in the head or neck.   4. Stable and normal for age CT appearance of the brain.   5. Chronic T3 compression fracture.   CTA head and neck 03/10/2020: No acute intracranial abnormality. Stable abnormal appearance of the cervical internal carotids. Stable abnormal appearance of the distal basilar artery.  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  No neurological symptoms and resolution of previous left facial paresthesia.   - Continue aspirin 81mg  and plavix 75mg  daily.  - I will request PCP to screen for renal artery stenosis given history of fibromuscular dysplasia  Return to clinic in 1 year  Thank you for allowing me to participate in patient's care.  If I can answer any additional questions, I would be pleased to do so.    Sincerely,    Anisa Leanos K. , DO

## 2021-01-19 ENCOUNTER — Telehealth: Payer: Self-pay | Admitting: Neurology

## 2021-01-19 NOTE — Telephone Encounter (Signed)
Brandy from [facility] called and LM. There is a problem with the code/exam. She recvd the notes for the ultrasound/ [phone]

## 2021-01-19 NOTE — Telephone Encounter (Signed)
Please see if they can use either of these: R35.0 Frequency of micturition  I70.8  Atherosclerosis of other arteries

## 2021-01-19 NOTE — Telephone Encounter (Signed)
I have faxed this information over to [place] at [facility].

## 2021-01-19 NOTE — Telephone Encounter (Signed)
Called and spoke to [place] at [facility] and she wanted to let Dr. Allena Katz know that the codes and notes for patient screening for renal artery stenosis are not working to get patient approved and scheduled. Gearldine Bienenstock stated that she will need [place] to send different codes in order to get patient scheduled. She wanted to inform Dr. Allena Katz that patients BP has been stable. Fax numbers provided: [phone] and [phone].

## 2021-01-20 NOTE — Telephone Encounter (Signed)
Spoke to Dr. Allena Katz and informed her that the codes we provided did not work. Can try I70.1 and Q27.30 to see if these will work. Faxing over to [place] at [facility].

## 2021-01-25 ENCOUNTER — Other Ambulatory Visit: Payer: Self-pay | Admitting: Neurology

## 2021-01-26 ENCOUNTER — Telehealth: Payer: Self-pay | Admitting: Neurology

## 2021-01-26 NOTE — Telephone Encounter (Signed)
Pt called in stating he saw Dr. Egbert Garibaldi and was given Prednisone. He said it helped, but has now come back "with a vengeance". He thinks it might be sciatica and wants to see if Dr. Allena Katz can help him with it?

## 2021-01-26 NOTE — Telephone Encounter (Signed)
Called patient and he stated that his sciatica starting acting up about a week before christmas. Patient stated that he has been using aleve and Asper cream for the pain. Patient also stated that Dr. Egbert Garibaldi put him on Prednisone and it helped a lot, but once he was done with the course of Prednisone his sciatica came back with vengeance in his lower back and right hip hurting. Patient states that it is so bad that he is having trouble walking. Informed patient that I will relay his message to Dr. Allena Katz and give him a call once I hear back.

## 2021-01-27 NOTE — Telephone Encounter (Signed)
Called patient and informed him that because this is a new complaint he will need to schedule an appointment with Dr. Allena Katz to be evaluated. Patient verbalized understanding and was transferred up front to make an appointment.

## 2021-01-27 NOTE — Telephone Encounter (Signed)
I am happy to see him.  He will need to schedule follow-up appointment for evaluation, since this is a new complaint.

## 2021-01-28 ENCOUNTER — Telehealth: Payer: Self-pay | Admitting: Neurology

## 2021-01-28 NOTE — Telephone Encounter (Signed)
Returned patients call and informed him that I had spoke to him yesterday and informed him that he will need to schedule a f/u with Dr. Allena Katz as this is a new issue.   Patient stated that he wanted to know if Dr. Allena Katz could send him something for his pain and I informed him that Dr. Allena Katz does not prescribe pain medication. Patient stated that there is something at the liquor store that has helped with his pain and a heating pad and he will continue doing that until he is seen on Monday.   Patient requested to be seen today and I informed patient that that is not possible as she is booked up today. I informed patient that if the pain is that bad or gets any worse he needs to be evaluated at the ER or Urgent care to ensure that nothing else is going on. Patient stated he will just wait until his appt Monday with Dr. Allena Katz.   Informed patient I would relay his message to Dr. Allena Katz to make her aware.

## 2021-01-28 NOTE — Telephone Encounter (Signed)
Patient called stating hes in a lot of pain.  He called yesterday and said no one returned his call.  I set up an appt for him for Monday at 8:30, but he wanted to talk to you about his pain.

## 2021-01-28 NOTE — Telephone Encounter (Signed)
Agree 

## 2021-01-31 ENCOUNTER — Other Ambulatory Visit: Payer: Self-pay

## 2021-01-31 ENCOUNTER — Ambulatory Visit: Payer: Medicare Other | Admitting: Neurology

## 2021-01-31 ENCOUNTER — Encounter: Payer: Self-pay | Admitting: Neurology

## 2021-01-31 VITALS — BP 146/81 | HR 71 | Ht 71.0 in | Wt 198.0 lb

## 2021-01-31 DIAGNOSIS — I773 Arterial fibromuscular dysplasia: Secondary | ICD-10-CM | POA: Diagnosis not present

## 2021-01-31 DIAGNOSIS — M5417 Radiculopathy, lumbosacral region: Secondary | ICD-10-CM | POA: Diagnosis not present

## 2021-01-31 MED ORDER — CYCLOBENZAPRINE HCL 5 MG PO TABS: 5.0000 mg | ORAL_TABLET | Freq: Two times a day (BID) | ORAL | 1 refills | Status: DC | PRN

## 2021-01-31 NOTE — Progress Notes (Signed)
Follow-up Visit   Date: 01/31/21   Bruce Watts MRN: [id number] DOB: 03-31-1945   Interim History: Bruce Watts is a 76 y.o. right-handed Caucasian male with asthma, GERD, hyperlipidemia returning to the clinic for acute visit for right leg pain.  He is  followed here for fibromuscular dysplasia.  The patient was accompanied to the clinic by self.  History of present illness: Starting around April/May 2020, he noticed numbness over the left side of the face which has remained constant since onset.  He also developed tingling over the left arm and leg which lasts a few hours and occurs several times per week.  He denies any specific trigger.  He denies vision changes, headache, arm or leg weakness. MRI brain was performed in October, which shows mild microvascular changes, no evidence of stroke.    UPDATE 03/03/2019:  He is here for follow-up visit.  He has marked improvement degree of numbness over the left side of the face, where he only notices it a few times per week. No tingling/numbness in the left arm or leg.  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  He is compliant with aspirin, plavix 75mg , statin, and BP medications. No new complaint today.   UPDATE 12/12/2019:  He has noticed lightheadeness with positional changes.  He continues to have spells of tingling over the left side of the face, which occur a few times per week (unchanged). CTA from May 2021 shows stable cervical ICA dissection and pseudoaneurysm, consistent with fibromuscular dysplasia.  No new neurological complaints.  He is requesting refills for plavix.  UPDATE 12/24/2020: He has been doing well.  He no longer has any left facial paresthesias. No new neurological complaints. He has been compliant with ASA, plavix, statin therapy, and BP medications.   Lightheadedness has resolved.   UPDATE 01/31/2021:  He  scheduled acute visit for right leg pain.  Starting around Christmas, he began having right sided radiating pain starting in his right buttocks and down his leg, described as throbbing, dull, pain.  Sometimes, he gets sharp pain and tingling.  He was having difficulty with walking initially.  He went to his PCP who gave him prednisone which significantly alleviated his pain, but once he completed the course, his pain returned.  He takes Aleve which eases his pain. No weakness of the leg.   Of note, his renal artery ultrasound has been denied by his insurance despite multiple attempts by my office and his PCP.  Fortunately, his BP is fairly well-controlled and he does not have any renal dysfunction.  Medications:  Current Outpatient Medications on File Prior to Visit  Medication Sig Dispense Refill   amLODipine (NORVASC) 5 MG tablet Take 5 mg by mouth daily.     aspirin EC 81 MG tablet Take 81 mg by mouth daily.     clopidogrel (PLAVIX) 75 MG tablet Take 1 tablet (75 mg total) by mouth daily. 90 tablet 3   fluticasone furoate-vilanterol (BREO ELLIPTA) 200-25 MCG/INH AEPB Inhale 1 puff into the lungs daily.     omeprazole (PRILOSEC) 40 MG capsule Take 40 mg by mouth daily.     pravastatin (PRAVACHOL) 20 MG tablet Take 20 mg by mouth daily.     No current facility-administered medications on file prior to visit.    Allergies: No Known Allergies  Vital Signs:  BP (!) 146/81    Pulse 71  Ht 5\' 11"  (1.803 m)    Wt 198 lb (89.8 kg)    SpO2 98%    BMI 27.62 kg/m   Neurological Exam: MENTAL STATUS including orientation to time, place, person, recent and remote memory, attention span and concentration, language, and fund of knowledge is normal.  Speech is not dysarthric.  CRANIAL NERVES:  No visual field defects.  Pupils equal round and reactive to light.  Normal conjugate, extra-ocular eye movements in all directions of gaze.  No ptosis.  Face is symmetric. Sensation on the face is normal.    MOTOR:  Motor strength is 5/5 in all extremities.  No drift. Straight leg raise negative.   SENSORY:  Intact to vibration, pin prick, and temperature throughout.  REFLEXES:  Reflexes are 2+/4 throughout and 1+/4 at the ankles bilaterally  COORDINATION/GAIT: Gait appears mildly antalgic, unassisted, stable.    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 Areas of irregular pseudoaneurysm formation without a dominant pseudoaneurysm. These abnormalities could be a cause of embolic disease. No large or medium vessel occlusion is identified with respect to the intracranial circulation.   Cerebral angiogram 12/24/2018: Approximately 13.6 mm x 5.6 mm outpouching of the right internal carotid artery at the junction of the cervical and middle 1/3 associated with fusiform dilatation of a total of 22.5 mm.   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 Underlying fibromuscular dysplasia by a differential consideration versus trauma versus spontaneous dissections associated with pseudoaneurysm formation.   CT/A head and neck 05/21/2019: 1. Stable abnormal cervical ICAs with unchanged dissections and pseudoaneurysms since December.   2. 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   3. 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 No arterial stenosis, intracranial aneurysm, or new arterial abnormality is identified in the head or  neck.   4. Stable and normal for age CT appearance of the brain.   5. Chronic T3 compression fracture.   CTA head and neck 03/10/2020: No acute intracranial abnormality. Stable abnormal appearance of the cervical internal carotids. Stable abnormal appearance of the distal basilar artery.  IMPRESSION/PLAN: Right lumbosacral radiculopathy - new  - Start flexeril 5mg  at bedtime, ok to titrate to 5mg  BID as needed  - Start physical therapy for low back strengthening  - Patient to give Mychart message 6-8 weeks, if no improvement MRI lumbar spine is the next step  2. 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  No neurological symptoms and resolution of previous left facial paresthesia.     - Continue aspirin 81mg  and plavix 75mg  daily.  - Monitor BP and renal function, ok to cancel renal artery ultrasound, given overall low suspicion for renal artery stenosis  Further recommendations pending results.   Thank you for allowing me to participate in patient's care.  If I can answer any additional questions, I would be pleased to do so.    Sincerely,    Marchell Froman K. , DO

## 2021-01-31 NOTE — Patient Instructions (Addendum)
Start physical therapy for right leg pain in [place]  Start flexeril 5mg  at bedtime, if it does not make you too sleepy, you may take it during the day  Please send a Mychart in about 6-8 weeks after completing therapy, so we can determine if MRI lumbar spine is needed

## 2021-02-04 ENCOUNTER — Encounter: Payer: Self-pay | Admitting: Neurology

## 2021-02-14 ENCOUNTER — Telehealth: Payer: Self-pay | Admitting: Neurology

## 2021-02-14 NOTE — Telephone Encounter (Signed)
Received [employer]. Will f/u when able to.

## 2021-02-14 NOTE — Telephone Encounter (Signed)
Patient called stating he still has not heard anything regarding his Physical Therapy from a few weeks ago. He was following up.

## 2021-02-15 NOTE — Telephone Encounter (Signed)
Referral has been re-faxed.

## 2021-02-15 NOTE — Telephone Encounter (Signed)
[place] at [phone] and asked if they have received patients referral. Was informed that patients referral has been received. Mychart message sent to patient to inform.

## 2021-02-21 ENCOUNTER — Ambulatory Visit: Payer: Medicare Other | Admitting: Neurology

## 2021-03-04 ENCOUNTER — Other Ambulatory Visit: Payer: Self-pay | Admitting: Neurology

## 2021-06-07 ENCOUNTER — Telehealth: Payer: Self-pay | Admitting: Neurology

## 2021-06-07 DIAGNOSIS — M5417 Radiculopathy, lumbosacral region: Secondary | ICD-10-CM

## 2021-06-07 NOTE — Telephone Encounter (Signed)
Patient done his PT, seemed to get better, but now it is back. He is in pain, and feels he needs to be seen sooner than october

## 2021-06-09 NOTE — Telephone Encounter (Signed)
Pt called an informed that Dr. Serita Grit last note, if PT not helpful, order mri lumbar spine. Pt agreed to MRI order placed in epic

## 2021-06-23 ENCOUNTER — Ambulatory Visit
Admission: RE | Admit: 2021-06-23 | Discharge: 2021-06-23 | Disposition: A | Discharge status: Home / Self Care | Payer: Medicare Other | Source: Ambulatory Visit | Attending: Neurology | Admitting: Neurology

## 2021-06-23 DIAGNOSIS — M5417 Radiculopathy, lumbosacral region: Secondary | ICD-10-CM

## 2021-06-23 MED ORDER — GADOBENATE DIMEGLUMINE 529 MG/ML IV SOLN
20.0000 mL | Freq: Once | INTRAVENOUS | Status: AC | PRN
  Administered 2021-06-23: 20 mL via INTRAVENOUS

## 2021-06-24 ENCOUNTER — Telehealth: Payer: Self-pay

## 2021-06-24 DIAGNOSIS — R9389 Abnormal findings on diagnostic imaging of other specified body structures: Secondary | ICD-10-CM

## 2021-06-24 NOTE — Telephone Encounter (Signed)
Pt called an informed that MRI does show possible pinching of a nerve in the back that may be causing right leg pain.  If physical therapy was ineffective, we can refer to neurosurgery (may be candidate for steroid injection vs surgery). Pt would like to go see neurosurgery.

## 2021-06-24 NOTE — Telephone Encounter (Signed)
-----   Message from Drema Dallas, DO sent at 06/24/2021 12:19 PM EDT ----- MRI does show possible pinching of a nerve in the back that may be causing right leg pain.  If physical therapy was ineffective, we can refer to neurosurgery (may be candidate for steroid injection vs surgery).

## 2021-10-24 ENCOUNTER — Ambulatory Visit: Payer: Medicare Other | Admitting: Neurology

## 2021-10-24 ENCOUNTER — Encounter: Payer: Self-pay | Admitting: Neurology

## 2021-10-24 VITALS — BP 120/82 | HR 95 | Ht 71.0 in | Wt 202.0 lb

## 2021-10-24 DIAGNOSIS — M5417 Radiculopathy, lumbosacral region: Secondary | ICD-10-CM | POA: Diagnosis not present

## 2021-10-24 DIAGNOSIS — I773 Arterial fibromuscular dysplasia: Secondary | ICD-10-CM

## 2021-10-24 MED ORDER — CLOPIDOGREL BISULFATE 75 MG PO TABS: 75.0000 mg | ORAL_TABLET | Freq: Every day | ORAL | 3 refills | Status: DC

## 2021-10-24 MED ORDER — CYCLOBENZAPRINE HCL 5 MG PO TABS: 5.0000 mg | ORAL_TABLET | Freq: Every evening | ORAL | 5 refills | Status: DC | PRN

## 2021-10-24 NOTE — Patient Instructions (Addendum)
CT angiogram of the head and neck  Start taking flexeril 5mg  at bedtime as needed  Return to clinic in 1 year

## 2021-10-24 NOTE — Progress Notes (Signed)
Follow-up Visit   Date: 10/24/21   Bruce Watts MRN: [id number] DOB: February 05, 1945   Interim History: Bruce Watts is a 76 y.o. right-handed Caucasian male with asthma, GERD, hyperlipidemia returning to the clinic for acute visit for right leg pain.  He is  followed here for fibromuscular dysplasia.  The patient was accompanied to the clinic by self.  History of present illness: Starting around April/May 2020, he noticed numbness over the left side of the face which has remained constant since onset.  He also developed tingling over the left arm and leg which lasts a few hours and occurs several times per week.  He denies any specific trigger.  He denies vision changes, headache, arm or leg weakness. MRI brain was performed in October, which shows mild microvascular changes, no evidence of stroke.    UPDATE 03/03/2019:  He is here for follow-up visit.  He has marked improvement degree of numbness over the left side of the face, where he only notices it a few times per week. No tingling/numbness in the left arm or leg.  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  He is compliant with aspirin, plavix 75mg , statin, and BP medications. No new complaint today.   UPDATE 12/12/2019:  He has noticed lightheadeness with positional changes.  He continues to have spells of tingling over the left side of the face, which occur a few times per week (unchanged). CTA from May 2021 shows stable cervical ICA dissection and pseudoaneurysm, consistent with fibromuscular dysplasia.  No new neurological complaints.  He is requesting refills for plavix.  UPDATE 01/31/2021:  He scheduled acute visit for right leg pain.  Starting around Christmas, he began having right sided radiating pain starting in his right buttocks and down his leg, described as throbbing, dull, pain.  Sometimes, he gets sharp pain and  tingling.  He was having difficulty with walking initially.  He went to his PCP who gave him prednisone which significantly alleviated his pain, but once he completed the course, his pain returned.  He takes Aleve which eases his pain. No weakness of the leg.   UPDATE 10/24/2021:  He underwent lumbar surgery for right lumbosacral radiculopathy at L5 and reports improved pain.  He continues to have episodic right hip pain a few times per week, but otherwise doing very well.  No new neurological symptoms.   Medications:  Current Outpatient Medications on File Prior to Visit  Medication Sig Dispense Refill   amLODipine (NORVASC) 5 MG tablet Take 5 mg by mouth daily.     aspirin EC 81 MG tablet Take 81 mg by mouth daily.     clopidogrel (PLAVIX) 75 MG tablet Take 1 tablet (75 mg total) by mouth daily. 90 tablet 3   cyclobenzaprine (FLEXERIL) 5 MG tablet TAKE 1 TABLET(5 MG) BY MOUTH TWICE DAILY AS NEEDED FOR MUSCLE SPASMS 60 tablet 5   fluticasone furoate-vilanterol (BREO ELLIPTA) 200-25 MCG/INH AEPB Inhale 1 puff into the lungs daily.     omeprazole (PRILOSEC) 40 MG capsule Take 40 mg by mouth daily.     pravastatin (PRAVACHOL) 20 MG tablet Take 20 mg by mouth daily.     No current facility-administered medications on file prior to visit.    Allergies: No Known Allergies  Vital Signs:  BP 120/82   Pulse 95   Ht 5\' 11"  (1.803 m)   Wt 202 lb (  91.6 kg)   SpO2 97%   BMI 28.17 kg/m   Neurological Exam: MENTAL STATUS including orientation to time, place, person, recent and remote memory, attention span and concentration, language, and fund of knowledge is normal.  Speech is not dysarthric.  CRANIAL NERVES:    Pupils equal round and reactive to light.  Normal conjugate, extra-ocular eye movements in all directions of gaze.  No ptosis.  Face is symmetric. Sensation on the face is normal.   MOTOR:  Motor strength is 5/5 in all extremities.  No drift.   SENSORY:  Intact to vibration, pin prick,  and temperature throughout.  REFLEXES:  Reflexes are 2+/4 throughout and 1+/4 at the ankles bilaterally  COORDINATION/GAIT: Gait appears stable.    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 Areas of irregular pseudoaneurysm formation without a dominant pseudoaneurysm. These abnormalities could be a cause of embolic disease. No large or medium vessel occlusion is identified with respect to the intracranial circulation.   Cerebral angiogram 12/24/2018: Approximately 13.6 mm x 5.6 mm outpouching of the right internal carotid artery at the junction of the cervical and middle 1/3 associated with fusiform dilatation of a total of 22.5 mm.   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 Underlying fibromuscular dysplasia by a differential consideration versus trauma versus spontaneous dissections associated with pseudoaneurysm formation.   CT/A head and neck 05/21/2019: 1. Stable abnormal cervical ICAs with unchanged dissections and pseudoaneurysms since December.   2. 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   3. 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 No arterial stenosis, intracranial aneurysm, or new arterial abnormality is identified in the head or neck.   4. Stable and normal for age CT appearance of the brain.   5. Chronic T3 compression fracture.   CTA head and neck 03/10/2020: No acute intracranial  abnormality. Stable abnormal appearance of the cervical internal carotids. Stable abnormal appearance of the distal basilar artery.  MRI lumbar spine 06/24/2021: Lumbar and lower thoracic spondylosis, as outlined and with findings most notably as follows.   At L5-S1, there is a disc bulge with endplate spurring. Superimposed 11 mm caudally migrated left subarticular disc extrusion. A small right foraminal disc protrusion is also present. The left subarticular disc extrusion results in moderate left subarticular stenosis, contacting and posteriorly displacing the descending left S1 nerve root. Correlate for left S1 radiculopathy. The right foraminal disc protrusion contributes to severe right neural foraminal narrowing, and encroaches upon the exiting right L5 nerve root. Correlate for right L5 radiculopathy. Mild-to-moderate left neural foraminal narrowing.   No more than mild spinal canal narrowing at the remaining levels. Additional sites of foraminal stenosis, as detailed and greatest bilaterally at L4-L5 (moderate).   Disc degeneration is greatest at L1-L2 (moderate to advanced) and L2-L3, L3-L4 and L4-L5 (advanced at these levels). Suspected early osseous fusion across the L2-L3 disc space.   Mild degenerate plate edema at Z6-X0.    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  No new neurological symptoms.   -  Repeat CTA head and neck to evaluate for interval change  - Continue aspirin 81mg  and plavix 75mg  daily  2.  Lumbosacral radiculopathy s/p surgery with improved right leg radicular pain (2023, Dr )  He continues to have episodic right hip pain  - OK to use flexeril 5mg  at bedtime as needed  Return to clinic in 1 year   Thank you for allowing me to participate in patient's care.  If I can answer any additional questions, I would be pleased to do so.    Sincerely,    Rishard Delange K. 02-08-1981, DO

## 2021-11-28 ENCOUNTER — Other Ambulatory Visit: Payer: Medicare Other

## 2021-11-28 ENCOUNTER — Inpatient Hospital Stay: Admission: RE | Admit: 2021-11-28 | Payer: Medicare Other | Source: Ambulatory Visit

## 2021-12-23 ENCOUNTER — Telehealth: Payer: Self-pay | Admitting: Neurology

## 2021-12-23 NOTE — Telephone Encounter (Signed)
Patient was had a MRI schedule with [facility] imaging and then got a call to let him know not to come  due to the insurance not approving the MRI he has not heard anything back from anyone so he is not sure what is going on can someone check on the approval and call patient

## 2021-12-23 NOTE — Telephone Encounter (Signed)
Messaged Taron at [facility] to check and see what happened with patients PA

## 2021-12-26 NOTE — Telephone Encounter (Signed)
Following up with [place] [facility] on this Pt MRI PA to see where we are with getting them scheduled

## 2021-12-27 ENCOUNTER — Encounter: Payer: Self-pay | Admitting: Neurology

## 2021-12-27 NOTE — Telephone Encounter (Signed)
Per Vashti Hey order sent to scheduling

## 2022-01-27 ENCOUNTER — Inpatient Hospital Stay: Admission: RE | Admit: 2022-01-27 | Payer: Medicare Other | Source: Ambulatory Visit

## 2022-01-27 ENCOUNTER — Ambulatory Visit
Admission: RE | Admit: 2022-01-27 | Discharge: 2022-01-27 | Disposition: A | Discharge status: Home / Self Care | Payer: Medicare Other | Source: Ambulatory Visit | Attending: Neurology | Admitting: Neurology

## 2022-01-27 DIAGNOSIS — M5417 Radiculopathy, lumbosacral region: Secondary | ICD-10-CM

## 2022-01-27 DIAGNOSIS — I773 Arterial fibromuscular dysplasia: Secondary | ICD-10-CM

## 2022-01-27 MED ORDER — IOPAMIDOL (ISOVUE-370) INJECTION 76%
75.0000 mL | Freq: Once | INTRAVENOUS | Status: AC | PRN
  Administered 2022-01-27: 75 mL via INTRAVENOUS

## 2022-03-20 ENCOUNTER — Other Ambulatory Visit: Payer: Self-pay | Admitting: Neurology

## 2022-03-20 NOTE — Telephone Encounter (Signed)
Called Pt to see if he is in need of med. It is as needed.

## 2022-03-22 ENCOUNTER — Telehealth: Payer: Self-pay | Admitting: Neurology

## 2022-03-22 NOTE — Telephone Encounter (Signed)
Pt called in wanting to find out if he still needs to take the cyclobenzaprine?

## 2022-03-23 MED ORDER — CYCLOBENZAPRINE HCL 5 MG PO TABS: 5.0000 mg | ORAL_TABLET | Freq: Every evening | ORAL | 5 refills | Status: AC | PRN

## 2022-03-23 NOTE — Telephone Encounter (Signed)
Reorderd.

## 2022-03-23 NOTE — Telephone Encounter (Signed)
He may stop taking it, if his right leg and low back pain is improved.  Otherwise, OK to take cyclobenzaprine at bedtime as needed.

## 2022-03-23 NOTE — Telephone Encounter (Signed)
Called and spoke to patient and informed him of Dr. Serita Grit recommendations. Patient wanted to let Dr. Posey Pronto know of his status with his hip and low back pain. He states it is painful, however Dr. Franky Macho will be administering injections soon. Patient would like to get a refill on his Flexeril to hold him off until he gets his shots.

## 2022-07-14 ENCOUNTER — Other Ambulatory Visit (HOSPITAL_COMMUNITY): Payer: Self-pay | Admitting: Urology

## 2022-07-14 DIAGNOSIS — C61 Malignant neoplasm of prostate: Secondary | ICD-10-CM

## 2022-07-31 ENCOUNTER — Ambulatory Visit (HOSPITAL_COMMUNITY)
Admission: RE | Admit: 2022-07-31 | Discharge: 2022-07-31 | Disposition: A | Discharge status: Home / Self Care | Payer: Medicare Other | Source: Ambulatory Visit | Attending: Urology | Admitting: Urology

## 2022-07-31 DIAGNOSIS — C61 Malignant neoplasm of prostate: Secondary | ICD-10-CM | POA: Diagnosis not present

## 2022-07-31 MED ORDER — PIFLIFOLASTAT F 18 (PYLARIFY) INJECTION
9.0000 | Freq: Once | INTRAVENOUS | Status: AC
  Administered 2022-07-31: 9.88 via INTRAVENOUS

## 2022-10-30 ENCOUNTER — Encounter: Payer: Self-pay | Admitting: Neurology

## 2022-10-30 ENCOUNTER — Ambulatory Visit: Payer: Medicare Other | Admitting: Neurology

## 2022-10-30 VITALS — BP 115/76 | HR 88 | Ht 71.0 in | Wt 198.0 lb

## 2022-10-30 DIAGNOSIS — I773 Arterial fibromuscular dysplasia: Secondary | ICD-10-CM

## 2022-10-30 MED ORDER — CLOPIDOGREL BISULFATE 75 MG PO TABS: 75.0000 mg | ORAL_TABLET | Freq: Every day | ORAL | 3 refills | Status: DC

## 2022-10-30 NOTE — Patient Instructions (Signed)
CTA head and neck in January

## 2022-10-30 NOTE — Progress Notes (Signed)
Follow-up Visit   Date: 10/30/22   Bruce Watts MRN: [id number] DOB: Jul 01, 1945   Interim History: Bruce Watts is a 77 y.o. right-handed Caucasian male with asthma, GERD, hyperlipidemia, and prostate cancer returning to the clinic for acute visit for right leg pain.  He is  followed here for fibromuscular dysplasia.  The patient was accompanied to the clinic by self.  History of present illness: Starting around April/May 2020, he noticed numbness over the left side of the face which has remained constant since onset.  He also developed tingling over the left arm and leg which lasts a few hours and occurs several times per week.  He denies any specific trigger.  He denies vision changes, headache, arm or leg weakness. MRI brain was performed in October, which shows mild microvascular changes, no evidence of stroke.    UPDATE 03/03/2019:  He is here for follow-up visit.  He has marked improvement degree of numbness over the left side of the face, where he only notices it a few times per week. No tingling/numbness in the left arm or leg.  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  He is compliant with aspirin, plavix 75mg , statin, and BP medications. No new complaint today.   UPDATE 12/12/2019:  He has noticed lightheadeness with positional changes.  He continues to have spells of tingling over the left side of the face, which occur a few times per week (unchanged). CTA from May 2021 shows stable cervical ICA dissection and pseudoaneurysm, consistent with fibromuscular dysplasia.  No new neurological complaints.  He is requesting refills for plavix.  UPDATE 01/31/2021:  He scheduled acute visit for right leg pain.  Starting around Christmas, he began having right sided radiating pain starting in his right buttocks and down his leg, described as throbbing, dull, pain.  Sometimes, he  gets sharp pain and tingling.  He was having difficulty with walking initially.  He went to his PCP who gave him prednisone which significantly alleviated his pain, but once he completed the course, his pain returned.  He takes Aleve which eases his pain. No weakness of the leg.   UPDATE 10/24/2021:  He underwent lumbar surgery for right lumbosacral radiculopathy at L5 and reports improved pain.  He continues to have episodic right hip pain a few times per week, but otherwise doing very well.  No new neurological symptoms.   UPDATE 10/30/2022:  He is here here for follow-up visit.  He was diagnosed with prostate cancer in the summer and completed radiation last week.  He continues to have low back pain which radiates into the right hip.  He is seeing [facility].  No new neurological symptoms.   Medications:  Current Outpatient Medications on File Prior to Visit  Medication Sig Dispense Refill   amLODipine (NORVASC) 5 MG tablet Take 5 mg by mouth daily.     aspirin EC 81 MG tablet Take 81 mg by mouth daily.     clopidogrel (PLAVIX) 75 MG tablet Take 1 tablet (75 mg total) by mouth daily. 90 tablet 3   cyclobenzaprine (FLEXERIL) 5 MG tablet Take 1 tablet (5 mg total) by mouth at bedtime as needed for muscle spasms. 30 tablet 5   fluticasone furoate-vilanterol (BREO ELLIPTA) 200-25 MCG/INH AEPB Inhale 1 puff into the lungs daily.     omeprazole (PRILOSEC) 40 MG capsule Take 40 mg by  mouth daily.     pravastatin (PRAVACHOL) 20 MG tablet Take 20 mg by mouth daily.     No current facility-administered medications on file prior to visit.    Allergies: No Known Allergies  Vital Signs:  BP 115/76   Pulse 88   Ht 5\' 11"  (1.803 m)   Wt 198 lb (89.8 kg)   SpO2 97%   BMI 27.62 kg/m   Neurological Exam: MENTAL STATUS including orientation to time, place, person, recent and remote memory, attention span and concentration, language, and fund of knowledge is normal.  Speech is not  dysarthric.  CRANIAL NERVES:    Pupils equal round and reactive to light.  Normal conjugate, extra-ocular eye movements in all directions of gaze.  No ptosis.  Face is symmetric. Sensation on the face is normal.   MOTOR:  Motor strength is 5/5 in all extremities.  No drift.   SENSORY:  Intact to vibration and temperature throughout.  REFLEXES:  Reflexes are 2+/4 throughout  COORDINATION/GAIT: Gait appears stable.    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 Areas of irregular pseudoaneurysm formation without a dominant pseudoaneurysm. These abnormalities could be a cause of embolic disease. No large or medium vessel occlusion is identified with respect to the intracranial circulation.   Cerebral angiogram 12/24/2018: Approximately 13.6 mm x 5.6 mm outpouching of the right internal carotid artery at the junction of the cervical and middle 1/3 associated with fusiform dilatation of a total of 22.5 mm.   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 Underlying fibromuscular dysplasia by a differential consideration versus trauma versus spontaneous dissections associated with pseudoaneurysm formation.   CT/A head and neck 05/21/2019: 1. Stable abnormal cervical ICAs with unchanged dissections and pseudoaneurysms since December.   2. 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   3. The above findings plus generalized arterial ectasia do favor sequelae of Connective Tissue  Disorder. No arterial stenosis, intracranial aneurysm, or new arterial abnormality is identified in the head or neck.   4. Stable and normal for age CT appearance of the brain.   5. Chronic T3 compression fracture.   CTA head and neck 03/10/2020: No acute intracranial abnormality. Stable abnormal appearance of the cervical internal carotids. Stable abnormal appearance of the distal basilar artery.  MRI lumbar spine 06/24/2021: Lumbar and lower thoracic spondylosis, as outlined and with findings most notably as follows.   At L5-S1, there is a disc bulge with endplate spurring. Superimposed 11 mm caudally migrated left subarticular disc extrusion. A small right foraminal disc protrusion is also present. The left subarticular disc extrusion results in moderate left subarticular stenosis, contacting and posteriorly displacing the descending left S1 nerve root. Correlate for left S1 radiculopathy. The right foraminal disc protrusion contributes to severe right neural foraminal narrowing, and encroaches upon the exiting right L5 nerve root. Correlate for right L5 radiculopathy. Mild-to-moderate left neural foraminal narrowing.   No more than mild spinal canal narrowing at the remaining levels. Additional sites of foraminal stenosis, as detailed and greatest bilaterally at L4-L5 (moderate).   Disc degeneration is greatest at L1-L2 (moderate  to advanced) and L2-L3, L3-L4 and L4-L5 (advanced at these levels). Suspected early osseous fusion across the L2-L3 disc space.   Mild degenerate plate edema at Z6-X0.   CTA head and neck 01/27/2022: 1. Fibromuscular dysplasia with unchanged aneurysmal dilatation and dissection flap affecting the right more than left distal cervical ICA. The ICA lumen measures up to 1 cm diameter on the right. 2. 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 3. No new abnormality.  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  No new neurological symptoms.   - Repeat CTA head and neck  - Continue aspirin 81mg  and plavix 75mg  daily  2.  Lumbosacral radiculopathy s/p surgery with improved right leg radicular pain (2023, Dr Maurice Small). He continues to have low back pain and hip pain.  He has seen PM&R at [facility] and will follow-up with them  - OK to use flexeril 5mg  at bedtime as needed  Return to clinic in 1 year   Thank you for allowing me to participate in patient's care.  If I can answer any additional questions, I would be pleased to do so.    Sincerely,    Riely Oetken K. Allena Katz, DO

## 2022-11-29 ENCOUNTER — Ambulatory Visit
Admission: RE | Admit: 2022-11-29 | Discharge: 2022-11-29 | Disposition: A | Discharge status: Home / Self Care | Payer: Medicare Other | Source: Ambulatory Visit | Attending: Radiation Oncology | Admitting: Radiation Oncology

## 2022-12-01 NOTE — Progress Notes (Signed)
   Department of Radiation Oncology  Phone:  [phone]    Follow-up Note    Name: Bruce Watts Date: 12/01/2022 MRN: [id number] DOB: Jan 09, 1946   Diagnosis: Adenocarcinoma of the prostate    MEDICATIONS: Current Outpatient Medications  Medication Sig Dispense Refill   albuterol (PROVENTIL) (2.5 MG/3ML) 0.083% nebulizer solution Take 2.5 mg by nebulization 4 (four) times daily.     finasteride (PROSCAR) 5 MG tablet Take 5 mg by mouth daily.     tamsulosin (FLOMAX) 0.4 MG CAPS capsule Take 0.8 mg by mouth daily.     amLODipine (NORVASC) 5 MG tablet Take 5 mg by mouth daily.     aspirin EC 81 MG tablet Take 81 mg by mouth daily.     clopidogrel (PLAVIX) 75 MG tablet Take 1 tablet (75 mg total) by mouth daily. 90 tablet 3   cyclobenzaprine (FLEXERIL) 5 MG tablet Take 1 tablet (5 mg total) by mouth at bedtime as needed for muscle spasms. 30 tablet 5   fluticasone furoate-vilanterol (BREO ELLIPTA) 200-25 MCG/INH AEPB Inhale 1 puff into the lungs daily.     omeprazole (PRILOSEC) 40 MG capsule Take 40 mg by mouth daily.     pravastatin (PRAVACHOL) 20 MG tablet Take 20 mg by mouth daily.     No current facility-administered medications for this encounter.     ALLERGIES: Patient has no known allergies.   LABORATORY DATA:  Lab Results  Component Value Date   WBC 7.7 12/24/2018   HGB 16.3 12/24/2018   HCT 48.9 12/24/2018   MCV 94.8 12/24/2018   PLT 209 12/24/2018   Lab Results  Component Value Date   NA 141 12/24/2018   K 4.5 12/24/2018   CL 106 12/24/2018   CO2 24 12/24/2018   No results found for: "ALT", "AST", "GGT", "ALKPHOS", "BILITOT"   NARRATIVE: Bruce Watts was seen today in follow-up for his adenocarcinoma of the prostate.  He completed external beam radiation approximately 6 weeks ago.  This is his first follow-up visit.  He continues to be followed by Dr. Saddie Benders, whom he saw last week.  He reports that his energy level has improved somewhat since completing  treatment.  He gets up once at night to urinate.  He denies any burning with urination, or urgency.   PHYSICAL EXAMINATION: height is 5\' 11"  (1.803 m) (pended) and weight is 200 lb 14.4 oz (91.1 kg) (pended). His oral temperature is 98.1 F (36.7 C) (pended). His blood pressure is 141/81 (abnormal, pended) and his pulse is 66 (pended). His respiration is 18 (pended) and oxygen saturation is 99% (pended).      He is in no apparent distress.  ASSESSMENT: He is doing well approximately 6 weeks out from completion of external beam radiation for his adenocarcinoma of the prostate.  His radiation related symptoms are significantly improved.  PLAN: As he continues to be followed routinely by Dr. Saddie Benders, further follow-up in our department will be on a as needed basis.  I encouraged him to contact me, however, with any questions or concerns he may have.

## 2022-12-15 ENCOUNTER — Encounter: Payer: Self-pay | Admitting: Neurology

## 2023-01-07 IMAGING — MR MR LUMBAR SPINE WO/W CM
4 of 7 series · 21 of 48 positions shown · IV contrast (20 ml multihance)
Comparison: None.

CLINICAL DATA: Provided history: Lumbar radiculopathy. Lumbosacral
radiculopathy. Additional history provided by scanning technologist:
Patient reports low back pain with right hip pain and leg tingling
for 6 months. Right foot numbness.

EXAM:
MRI LUMBAR SPINE WITHOUT AND WITH CONTRAST
TECHNIQUE: Multiplanar and multiecho pulse sequences of the lumbar spine were
obtained without and with intravenous contrast.
CONTRAST:  20mL MULTIHANCE GADOBENATE DIMEGLUMINE 529 MG/ML IV SOLN

[Series 3: T1 · sagittal · 4.0mm · 0.88mm/px · 3 of 15 slices shown (1 of 2)]
[im 1/15]
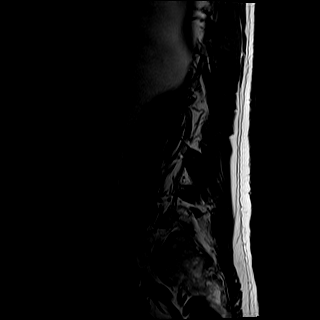
[im 8/15]
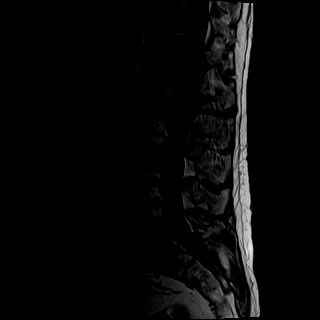
[im 15/15]
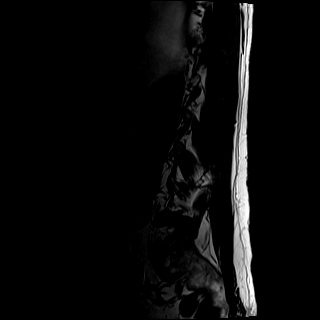

[Series 5: T2 · axial · 4.0mm · 0.39mm/px · z∈[-75,+139]mm · 11 of 39 slices shown (1 of 2)]
[im 1/39]
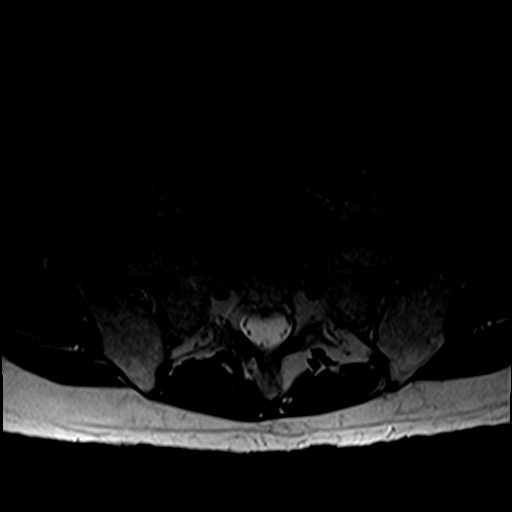
[im 4/39]
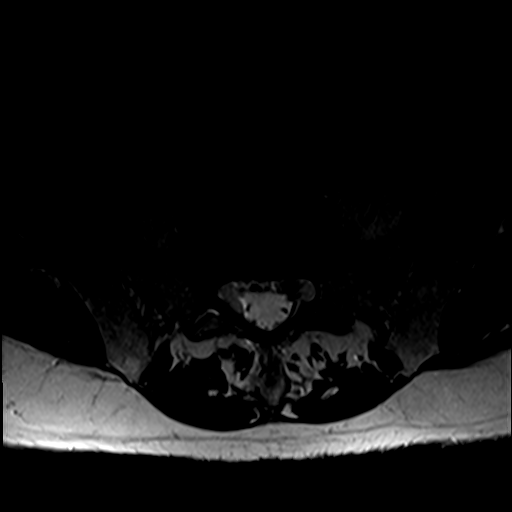
[im 8/39]
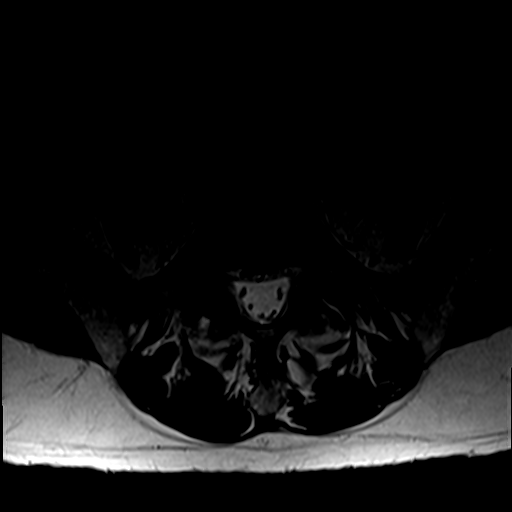
[im 12/39]
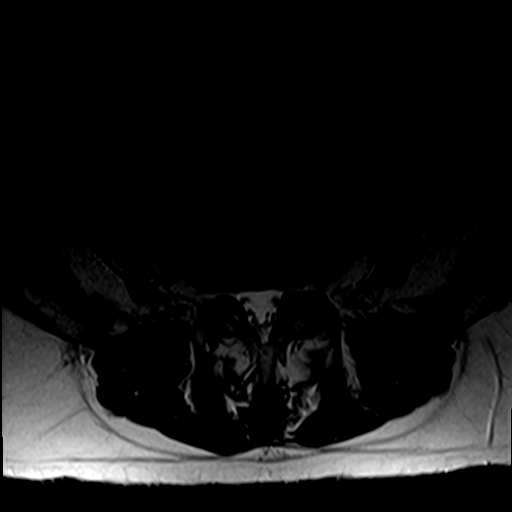
[im 16/39]
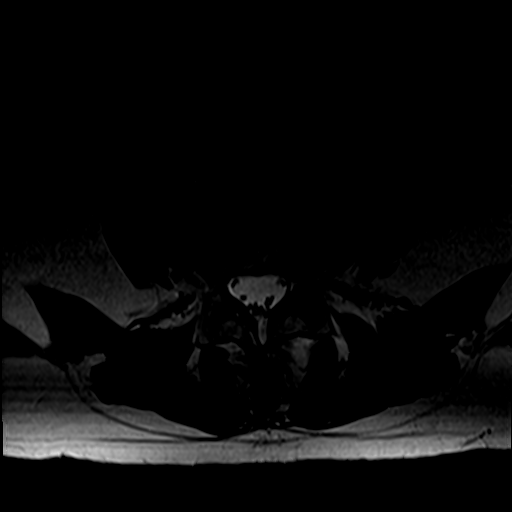
[im 20/39]
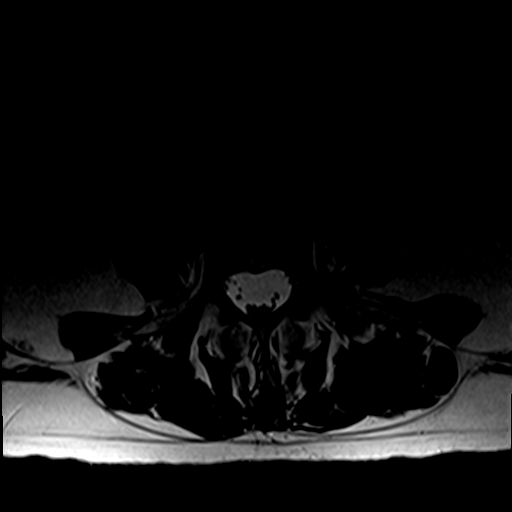
[im 23/39]
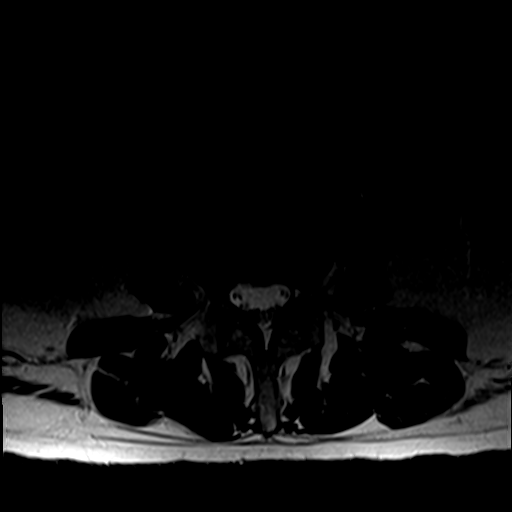
[im 27/39]
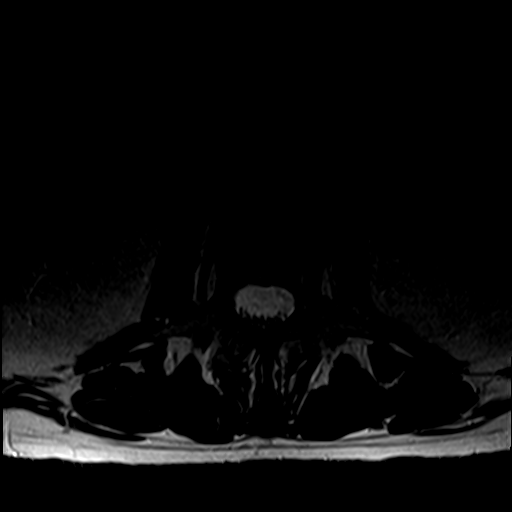
[im 31/39]
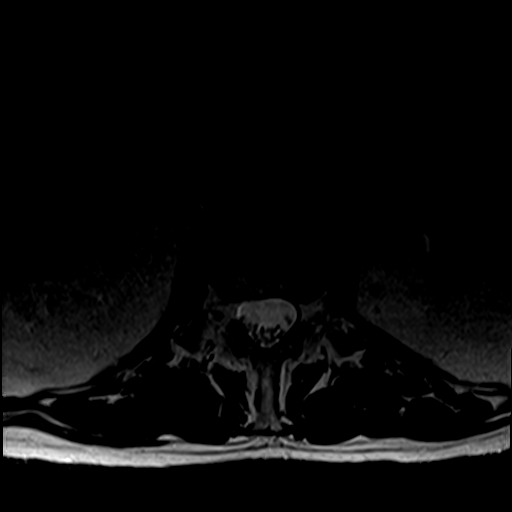
[im 35/39]
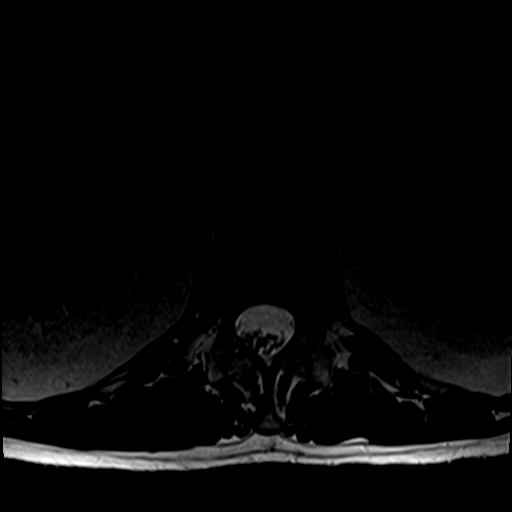
[im 39/39]
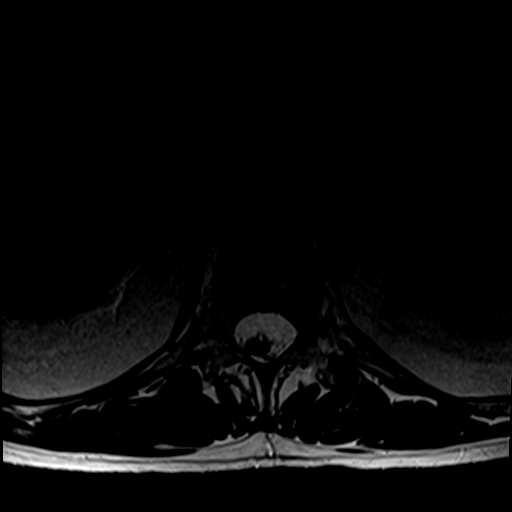

[Series 6: T1 · axial · 4.0mm · 0.39mm/px · z∈[-61,+120]mm · 3 of 39 slices shown (2 of 2)]
[im 4/39]
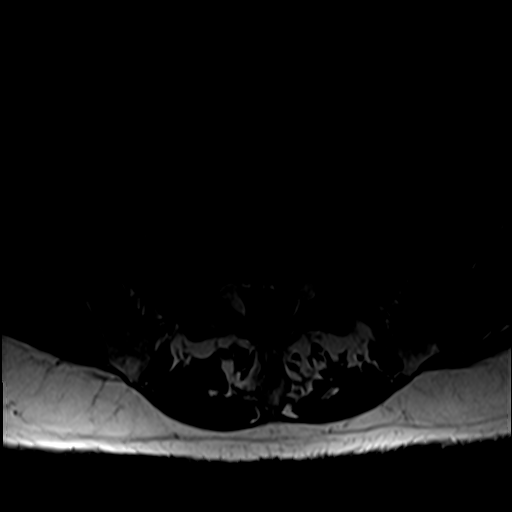
[im 20/39]
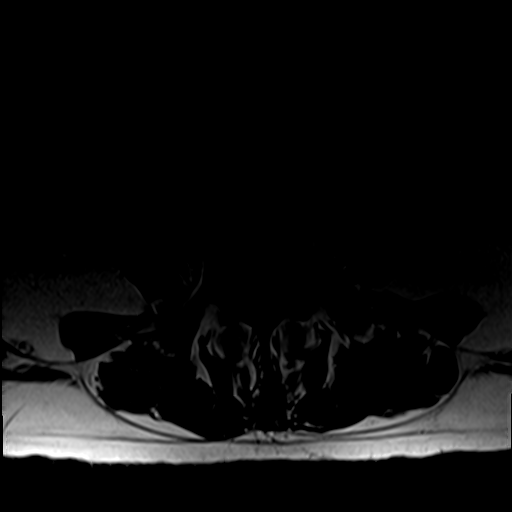
[im 35/39]
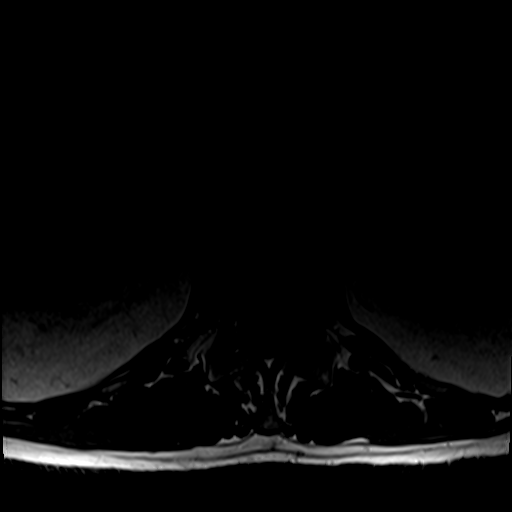

[Series 7: T2 · sagittal · 4.0mm · 1.09mm/px · 4 of 15 slices shown (2 of 2)]
[im 1/15]
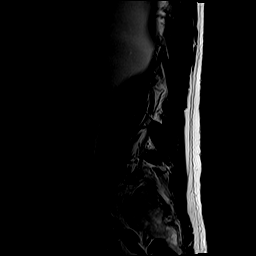
[im 5/15]
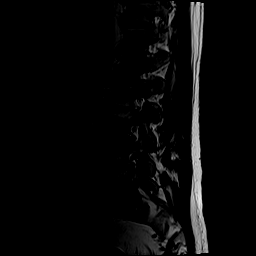
[im 10/15]
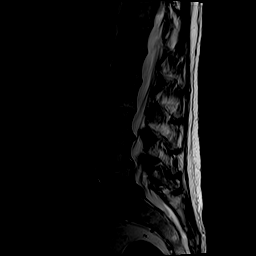
[im 15/15]
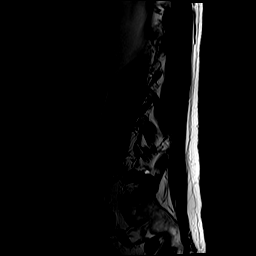

[21 of 48 positions shown; findings below may reference images not displayed]

FINDINGS: Segmentation: Five lumbar vertebrae are assumed and the caudal-most
well-formed intervertebral disc space is designated L5-S1.

Alignment: Trace grade 1 retrolisthesis at T11-T12, L1-L2, L2-L3 and
L4-L5.

Vertebrae: No lumbar vertebral compression fracture. Multilevel
degenerative endplate irregularity with Schmorl nodes. Multilevel
fatty degenerative endplate marrow signal. Mild degenerative
endplate edema and enhancement at L5-S1. Multilevel ventrolateral
osteophytes.

Conus medullaris and cauda equina: Conus extends to the L1 level. No
signal abnormality within the visualized distal spinal cord. No
pathologic enhancement of the visualized distal spinal cord.

Paraspinal and other soft tissues: No abnormality identified within
included portions of the abdomen/retroperitoneum. No paraspinal mass
or collection.

Disc levels:

Multilevel disc degeneration. Disc degeneration is greatest at L1-L2
(moderate to advanced), L2-L3 (advanced) L3-L4 (advanced) at L4-L5
(advanced). Early osseous fusion is suspected across the disc space
at L2-L3 (for instance as seen on series 7, image 7).

T11-T12: This level is imaged in the sagittal plane only. Trace
grade 1 retrolisthesis. Small disc bulge. Minimal facet arthrosis.
No significant spinal canal or foraminal stenosis.

T12-L1: Small disc bulge. No significant spinal canal or foraminal
stenosis.

L1-L2: Disc bulge asymmetric to the right. Endplate spurring
greatest along the right aspect of the disc space. Mild relative
right subarticular narrowing (without nerve root impingement).
Central canal patent. Mild right neural foraminal narrowing.

L2-L3: Disc bulge with endplate osteophytes. Minimal facet arthrosis
and ligamentum flavum hypertrophy. No significant spinal canal
stenosis. Mild bilateral neural foraminal narrowing.

L3-L4: Disc bulge with endplate spurring. Mild facet arthrosis.
Minimal relative bilateral subarticular and central canal narrowing
(without nerve root impingement). Mild bilateral neural foraminal
narrowing.

L4-L5: Disc bulge with endplate osteophytes. Facet arthrosis (mild
to moderate right, mild left). Left-sided ligamentum flavum
hypertrophy. No significant spinal canal stenosis. Moderate
bilateral neural foraminal narrowing.

L5-S1: Disc bulge with endplate spurring. Superimposed 11 mm left
subarticular disc extrusion with caudal migration to the upper S1
vertebral level. A small right foraminal disc protrusion is also
present (for instance as seen on series 3, image 5). The left
subarticular disc extrusion results in moderate left subarticular
stenosis, contacting and posteriorly displacing the descending left
S1 nerve root. No significant central canal stenosis. The right
foraminal disc protrusion contributes to severe right neural
foraminal narrowing and encroaches upon the exiting right L5 nerve
root (series 3, image 5). Mild-to-moderate left neural foraminal
narrowing.
IMPRESSION: Lumbar and lower thoracic spondylosis, as outlined and with findings
most notably as follows.

At L5-S1, there is a disc bulge with endplate spurring. Superimposed
11 mm caudally migrated left subarticular disc extrusion. A small
right foraminal disc protrusion is also present. The left
subarticular disc extrusion results in moderate left subarticular
stenosis, contacting and posteriorly displacing the descending left
S1 nerve root. Correlate for left S1 radiculopathy. The right
foraminal disc protrusion contributes to severe right neural
foraminal narrowing, and encroaches upon the exiting right L5 nerve
root. Correlate for right L5 radiculopathy. Mild-to-moderate left
neural foraminal narrowing.

No more than mild spinal canal narrowing at the remaining levels.
Additional sites of foraminal stenosis, as detailed and greatest
bilaterally at L4-L5 (moderate).

Disc degeneration is greatest at L1-L2 (moderate to advanced) and
L2-L3, L3-L4 and L4-L5 (advanced at these levels). Suspected early
osseous fusion across the L2-L3 disc space.

Mild degenerate plate edema at L5-S1.

## 2023-01-12 ENCOUNTER — Ambulatory Visit
Admission: RE | Admit: 2023-01-12 | Discharge: 2023-01-12 | Disposition: A | Discharge status: Home / Self Care | Payer: Medicare Other | Source: Ambulatory Visit | Attending: Neurology | Admitting: Neurology

## 2023-01-12 ENCOUNTER — Other Ambulatory Visit: Payer: Self-pay | Admitting: Neurology

## 2023-01-12 DIAGNOSIS — I773 Arterial fibromuscular dysplasia: Secondary | ICD-10-CM

## 2023-01-12 MED ORDER — IOPAMIDOL (ISOVUE-370) INJECTION 76%
200.0000 mL | Freq: Once | INTRAVENOUS | Status: AC | PRN
  Administered 2023-01-12: 75 mL via INTRAVENOUS

## 2023-04-09 ENCOUNTER — Encounter: Payer: Self-pay | Admitting: Neurology

## 2023-07-06 ENCOUNTER — Other Ambulatory Visit (HOSPITAL_BASED_OUTPATIENT_CLINIC_OR_DEPARTMENT_OTHER): Payer: Self-pay

## 2023-07-06 DIAGNOSIS — Z0189 Encounter for other specified special examinations: Secondary | ICD-10-CM

## 2023-07-06 DIAGNOSIS — H903 Sensorineural hearing loss, bilateral: Secondary | ICD-10-CM

## 2023-07-06 DIAGNOSIS — R42 Dizziness and giddiness: Secondary | ICD-10-CM

## 2023-07-07 ENCOUNTER — Encounter (HOSPITAL_COMMUNITY): Payer: Self-pay | Admitting: Interventional Radiology

## 2023-07-25 ENCOUNTER — Encounter (HOSPITAL_BASED_OUTPATIENT_CLINIC_OR_DEPARTMENT_OTHER): Payer: Self-pay

## 2023-07-25 MED ORDER — GADOBUTROL 1 MMOL/ML IV SOLN: 9.1000 mL | Freq: Once | INTRAVENOUS | Status: DC | PRN

## 2023-07-26 ENCOUNTER — Ambulatory Visit (HOSPITAL_BASED_OUTPATIENT_CLINIC_OR_DEPARTMENT_OTHER)
Admission: RE | Admit: 2023-07-26 | Discharge: 2023-07-26 | Disposition: A | Discharge status: Home / Self Care | Source: Ambulatory Visit

## 2023-07-26 ENCOUNTER — Encounter (HOSPITAL_BASED_OUTPATIENT_CLINIC_OR_DEPARTMENT_OTHER): Payer: Self-pay

## 2023-08-02 ENCOUNTER — Ambulatory Visit (INDEPENDENT_AMBULATORY_CARE_PROVIDER_SITE_OTHER)
Admission: RE | Admit: 2023-08-02 | Discharge: 2023-08-02 | Disposition: A | Discharge status: Home / Self Care | Source: Ambulatory Visit

## 2023-08-02 DIAGNOSIS — Z0189 Encounter for other specified special examinations: Secondary | ICD-10-CM

## 2023-08-02 DIAGNOSIS — R2681 Unsteadiness on feet: Secondary | ICD-10-CM

## 2023-08-02 DIAGNOSIS — R42 Dizziness and giddiness: Secondary | ICD-10-CM

## 2023-08-02 DIAGNOSIS — H903 Sensorineural hearing loss, bilateral: Secondary | ICD-10-CM

## 2023-08-02 MED ORDER — GADOBUTROL 1 MMOL/ML IV SOLN
9.1000 mL | Freq: Once | INTRAVENOUS | Status: AC | PRN
  Administered 2023-08-02: 9.1 mL via INTRAVENOUS

## 2023-10-30 ENCOUNTER — Ambulatory Visit: Payer: Medicare Other | Admitting: Neurology

## 2023-11-05 ENCOUNTER — Ambulatory Visit: Admitting: Neurology

## 2023-11-05 ENCOUNTER — Encounter: Payer: Self-pay | Admitting: Neurology

## 2023-11-05 VITALS — BP 116/75 | HR 77 | Ht 71.0 in | Wt 193.0 lb

## 2023-11-05 DIAGNOSIS — I773 Arterial fibromuscular dysplasia: Secondary | ICD-10-CM

## 2023-11-05 MED ORDER — CLOPIDOGREL BISULFATE 75 MG PO TABS: 75.0000 mg | ORAL_TABLET | Freq: Every day | ORAL | 3 refills | Status: AC

## 2023-11-05 NOTE — Progress Notes (Signed)
 Follow-up Visit   Date: 11/05/23   Bruce Watts MRN: [id number] DOB: 1945/08/24   Interim History: Bruce Watts is a 78 y.o. right-handed Caucasian male with asthma, GERD, hyperlipidemia, and prostate cancer returning to the clinic for acute visit for right leg pain.  He is  followed here for fibromuscular dysplasia.  The patient was accompanied to the clinic by self.  History of present illness: Starting around April/May 2020, he noticed numbness over the left side of the face which has remained constant since onset.  He also developed tingling over the left arm and leg which lasts a few hours and occurs several times per week.  He denies any specific trigger.  He denies vision changes, headache, arm or leg weakness. MRI brain was performed in October, which shows mild microvascular changes, no evidence of stroke.    UPDATE 03/03/2019:  He is here for follow-up visit.  He has marked improvement degree of numbness over the left side of the face, where he only notices it a few times per week. No tingling/numbness in the left arm or leg.  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  He is compliant with aspirin, plavix  75mg , statin, and BP medications. No new complaint today.   UPDATE 12/12/2019:  He has noticed lightheadeness with positional changes.  He continues to have spells of tingling over the left side of the face, which occur a few times per week (unchanged). CTA from May 2021 shows stable cervical ICA dissection and pseudoaneurysm, consistent with fibromuscular dysplasia.  No new neurological complaints.  He is requesting refills for plavix .  UPDATE 01/31/2021:  He scheduled acute visit for right leg pain.  Starting around Christmas, he began having right sided radiating pain starting in his right buttocks and down his leg, described as throbbing, dull, pain.  Sometimes, he  gets sharp pain and tingling.  He was having difficulty with walking initially.  He went to his PCP who gave him prednisone which significantly alleviated his pain, but once he completed the course, his pain returned.  He takes Aleve which eases his pain. No weakness of the leg.   UPDATE 10/24/2021:  He underwent lumbar surgery for right lumbosacral radiculopathy at L5 and reports improved pain.  He continues to have episodic right hip pain a few times per week, but otherwise doing very well.  No new neurological symptoms.   UPDATE 10/30/2022:  He is here here for follow-up visit.  He was diagnosed with prostate cancer in the summer and completed radiation last week.  He continues to have low back pain which radiates into the right hip.  He is seeing [facility].  No new neurological symptoms.   UPDATE 11/05/2023:  He is here for follow-up visit.  He has been doing well and denies any new neurological symptoms.  He is compliant with plavix  and stopped aspirin several months ago.    He was visiting friends in [place]  about 2 weeks ago and had a fall after several dogs tripped him.  Otherwise, he denies imbalance or weakness.  He has low back pain and takes Aleve.  He was given diclofenac by the [facility] for pain.    Medications:  Current Outpatient Medications on File Prior to Visit  Medication Sig Dispense Refill   albuterol (PROVENTIL) (2.5 MG/3ML) 0.083% nebulizer solution Take 2.5 mg by nebulization 4 (four) times daily.  amLODipine (NORVASC) 5 MG tablet Take 5 mg by mouth daily.     clopidogrel  (PLAVIX ) 75 MG tablet Take 1 tablet (75 mg total) by mouth daily. 90 tablet 3   cyclobenzaprine  (FLEXERIL ) 5 MG tablet Take 1 tablet (5 mg total) by mouth at bedtime as needed for muscle spasms. 30 tablet 5   fluticasone furoate-vilanterol (BREO ELLIPTA) 200-25 MCG/INH AEPB Inhale 1 puff into the lungs daily.     omeprazole (PRILOSEC) 40 MG capsule Take 40 mg by mouth daily.      pravastatin (PRAVACHOL) 20 MG tablet Take 20 mg by mouth daily.     tamsulosin (FLOMAX) 0.4 MG CAPS capsule Take 0.8 mg by mouth daily.     aspirin EC 81 MG tablet Take 81 mg by mouth daily. (Patient not taking: Reported on 11/05/2023)     finasteride (PROSCAR) 5 MG tablet Take 5 mg by mouth daily. (Patient not taking: Reported on 11/05/2023)     No current facility-administered medications on file prior to visit.    Allergies: No Known Allergies  Vital Signs:  BP 116/75   Pulse 77   Ht 5' 11 (1.803 m)   Wt 193 lb (87.5 kg)   SpO2 98%   BMI 26.92 kg/m   Neurological Exam: MENTAL STATUS including orientation to time, place, person, recent and remote memory, attention span and concentration, language, and fund of knowledge is normal.  Speech is not dysarthric.  CRANIAL NERVES:    Pupils equal round and reactive to light.  Normal conjugate, extra-ocular eye movements in all directions of gaze.  No ptosis.  Face is symmetric. Sensation on the face is normal.   MOTOR:  Motor strength is 5/5 in all extremities.  No drift.   SENSORY:  Intact to vibration and temperature throughout.  REFLEXES:  Reflexes are 2+/4 throughout  COORDINATION/GAIT: Gait appears stable.    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 Areas of irregular pseudoaneurysm formation without a dominant pseudoaneurysm. These abnormalities could be a cause of embolic disease. No large or medium vessel occlusion is identified with respect to the intracranial circulation.   Cerebral angiogram 12/24/2018: Approximately 13.6 mm x 5.6 mm outpouching of the right internal carotid artery at the junction of the cervical and middle 1/3 associated with fusiform dilatation of a total of 22.5 mm.   Approximately 11.9 mm x 5.9 mm saccular  outpouching projecting superiorly arising in the distal cervical ICA most consistent with a pseudoaneurysm associated with moderate fusiform dilatation. 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 Underlying fibromuscular dysplasia by a differential consideration versus trauma versus spontaneous dissections associated with pseudoaneurysm formation.   CT/A head and neck 05/21/2019: 1. Stable abnormal cervical ICAs with unchanged dissections and pseudoaneurysms since December.   2. 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   3. 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 No arterial stenosis, intracranial aneurysm, or new arterial abnormality is identified in the head or neck.   4. Stable and normal for age CT appearance of the brain.   5. Chronic T3 compression fracture.   CTA head and neck 03/10/2020: No acute intracranial abnormality. Stable abnormal appearance of the cervical internal carotids. Stable abnormal appearance of the  distal basilar artery.  MRI lumbar spine 06/24/2021: Lumbar and lower thoracic spondylosis, as outlined and with findings most notably as follows.   At L5-S1, there is a disc bulge with endplate spurring. Superimposed 11 mm caudally migrated left subarticular disc extrusion. A small right foraminal disc protrusion is also present. The left subarticular disc extrusion results in moderate left subarticular stenosis, contacting and posteriorly displacing the descending left S1 nerve root. Correlate for left S1 radiculopathy. The right foraminal disc protrusion contributes to severe right neural foraminal narrowing, and encroaches upon the exiting right L5 nerve root. Correlate for right L5 radiculopathy. Mild-to-moderate left neural foraminal narrowing.   No more than  mild spinal canal narrowing at the remaining levels. Additional sites of foraminal stenosis, as detailed and greatest bilaterally at L4-L5 (moderate).   Disc degeneration is greatest at L1-L2 (moderate to advanced) and L2-L3, L3-L4 and L4-L5 (advanced at these levels). Suspected early osseous fusion across the L2-L3 disc space.   Mild degenerate plate edema at O4-D8.   CTA head and neck 01/27/2022: 1. Fibromuscular dysplasia with unchanged aneurysmal dilatation and dissection flap affecting the right more than left distal cervical ICA. The ICA lumen measures up to 1 cm diameter on the right. 2. 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 3. No new abnormality.  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  He has been on dual antiplatelet therapy for sometime and has not had any new changes on imaging or by clinical history. Exam is stable.    - Will plan to check CTA head and neck next year - Continue plavix  75mg  daily  2.  Lumbosacral radiculopathy s/p surgery with improved right leg radicular pain (2023, Dr Cheryle). He continues to have right hip pain.  - OK to use flexeril  5mg  at bedtime as needed  Return to clinic in 1 year   Thank you for allowing me to participate in patient's care.  If I can answer any additional questions, I would be pleased to do so.    Sincerely,    Marcus Groll K. Tobie, DO

## 2023-12-24 ENCOUNTER — Telehealth: Payer: Self-pay | Admitting: Neurology

## 2023-12-24 NOTE — Telephone Encounter (Signed)
 Tamer (self) called and lvm wanting to speak with Dr. Tobie or the nurse.  PH: [phone]

## 2023-12-24 NOTE — Telephone Encounter (Signed)
 Paperwork faxed successfully to [facility]. Patient aware it has been re-faxed multiple times and we finally got a successful fax result.
